# Patient Record
Sex: Female | Born: 1980 | Race: Black or African American | Hispanic: No | Marital: Married | Smoking: Never smoker
Health system: Southern US, Community
[De-identification: ages and names within clinical notes are randomized; demographics above are authoritative.]

## PROBLEM LIST (undated history)

## (undated) ENCOUNTER — Inpatient Hospital Stay (HOSPITAL_COMMUNITY): Payer: Self-pay

## (undated) DIAGNOSIS — R102 Pelvic and perineal pain: Secondary | ICD-10-CM

## (undated) DIAGNOSIS — N760 Acute vaginitis: Secondary | ICD-10-CM

## (undated) DIAGNOSIS — G8929 Other chronic pain: Secondary | ICD-10-CM

## (undated) DIAGNOSIS — Z862 Personal history of diseases of the blood and blood-forming organs and certain disorders involving the immune mechanism: Secondary | ICD-10-CM

## (undated) DIAGNOSIS — R519 Headache, unspecified: Secondary | ICD-10-CM

## (undated) DIAGNOSIS — B009 Herpesviral infection, unspecified: Secondary | ICD-10-CM

## (undated) DIAGNOSIS — Z8742 Personal history of other diseases of the female genital tract: Secondary | ICD-10-CM

## (undated) DIAGNOSIS — N83202 Unspecified ovarian cyst, left side: Secondary | ICD-10-CM

## (undated) DIAGNOSIS — E785 Hyperlipidemia, unspecified: Secondary | ICD-10-CM

## (undated) DIAGNOSIS — Z8619 Personal history of other infectious and parasitic diseases: Secondary | ICD-10-CM

## (undated) DIAGNOSIS — F419 Anxiety disorder, unspecified: Secondary | ICD-10-CM

## (undated) DIAGNOSIS — N809 Endometriosis, unspecified: Secondary | ICD-10-CM

## (undated) DIAGNOSIS — Z8719 Personal history of other diseases of the digestive system: Secondary | ICD-10-CM

## (undated) DIAGNOSIS — Z87898 Personal history of other specified conditions: Secondary | ICD-10-CM

## (undated) DIAGNOSIS — F410 Panic disorder [episodic paroxysmal anxiety] without agoraphobia: Secondary | ICD-10-CM

## (undated) DIAGNOSIS — N926 Irregular menstruation, unspecified: Secondary | ICD-10-CM

## (undated) DIAGNOSIS — B379 Candidiasis, unspecified: Secondary | ICD-10-CM

## (undated) DIAGNOSIS — O26899 Other specified pregnancy related conditions, unspecified trimester: Secondary | ICD-10-CM

## (undated) DIAGNOSIS — R12 Heartburn: Secondary | ICD-10-CM

## (undated) DIAGNOSIS — R42 Dizziness and giddiness: Secondary | ICD-10-CM

## (undated) DIAGNOSIS — Z973 Presence of spectacles and contact lenses: Secondary | ICD-10-CM

## (undated) DIAGNOSIS — D649 Anemia, unspecified: Secondary | ICD-10-CM

## (undated) DIAGNOSIS — B9689 Other specified bacterial agents as the cause of diseases classified elsewhere: Secondary | ICD-10-CM

## (undated) DIAGNOSIS — IMO0002 Reserved for concepts with insufficient information to code with codable children: Secondary | ICD-10-CM

## (undated) HISTORY — DX: Hyperlipidemia, unspecified: E78.5

## (undated) HISTORY — DX: Herpesviral infection, unspecified: B00.9

## (undated) HISTORY — DX: Acute vaginitis: N76.0

## (undated) HISTORY — DX: Candidiasis, unspecified: B37.9

## (undated) HISTORY — DX: Endometriosis, unspecified: N80.9

## (undated) HISTORY — DX: Headache, unspecified: R51.9

## (undated) HISTORY — DX: Other specified bacterial agents as the cause of diseases classified elsewhere: B96.89

## (undated) HISTORY — DX: Irregular menstruation, unspecified: N92.6

## (undated) HISTORY — DX: Other chronic pain: G89.29

## (undated) HISTORY — PX: DIAGNOSTIC LAPAROSCOPY: SUR761

## (undated) HISTORY — DX: Panic disorder (episodic paroxysmal anxiety): F41.0

## (undated) HISTORY — PX: WISDOM TOOTH EXTRACTION: SHX21

## (undated) HISTORY — DX: Personal history of other specified conditions: Z87.898

## (undated) HISTORY — PX: OTHER SURGICAL HISTORY: SHX169

## (undated) HISTORY — DX: Dizziness and giddiness: R42

## (undated) HISTORY — DX: Pelvic and perineal pain: R10.2

## (undated) HISTORY — DX: Personal history of other diseases of the digestive system: Z87.19

## (undated) HISTORY — DX: Reserved for concepts with insufficient information to code with codable children: IMO0002

## (undated) HISTORY — DX: Personal history of other diseases of the female genital tract: Z87.42

## (undated) HISTORY — DX: Personal history of other infectious and parasitic diseases: Z86.19

---
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
## 2005-11-13 ENCOUNTER — Other Ambulatory Visit: Admission: RE | Admit: 2005-11-13 | Discharge: 2005-11-13 | Payer: Self-pay | Admitting: Obstetrics and Gynecology

## 2007-04-28 DIAGNOSIS — Z8719 Personal history of other diseases of the digestive system: Secondary | ICD-10-CM

## 2007-04-28 HISTORY — DX: Personal history of other diseases of the digestive system: Z87.19

## 2009-04-09 HISTORY — PX: DIAGNOSTIC LAPAROSCOPY: SUR761

## 2009-10-23 ENCOUNTER — Emergency Department (HOSPITAL_COMMUNITY): Admission: EM | Admit: 2009-10-23 | Discharge: 2009-10-23 | Payer: Self-pay | Admitting: Emergency Medicine

## 2010-02-23 ENCOUNTER — Ambulatory Visit (HOSPITAL_COMMUNITY): Admission: RE | Admit: 2010-02-23 | Discharge: 2010-02-23 | Payer: Self-pay | Admitting: Obstetrics and Gynecology

## 2010-06-08 DIAGNOSIS — N809 Endometriosis, unspecified: Secondary | ICD-10-CM

## 2010-06-08 HISTORY — DX: Endometriosis, unspecified: N80.9

## 2010-06-20 LAB — SURGICAL PCR SCREEN
MRSA, PCR: NEGATIVE
Staphylococcus aureus: POSITIVE — AB

## 2010-06-20 LAB — CBC
Hemoglobin: 11.9 g/dL — ABNORMAL LOW (ref 12.0–15.0)
RBC: 4.22 MIL/uL (ref 3.87–5.11)

## 2010-06-24 LAB — URINALYSIS, ROUTINE W REFLEX MICROSCOPIC
Protein, ur: NEGATIVE mg/dL
Urobilinogen, UA: 0.2 mg/dL (ref 0.0–1.0)

## 2010-06-24 LAB — WET PREP, GENITAL

## 2010-06-24 LAB — DIFFERENTIAL
Basophils Absolute: 0 10*3/uL (ref 0.0–0.1)
Eosinophils Relative: 3 % (ref 0–5)
Lymphocytes Relative: 29 % (ref 12–46)
Monocytes Absolute: 0.4 10*3/uL (ref 0.1–1.0)
Monocytes Relative: 8 % (ref 3–12)

## 2010-06-24 LAB — CBC
HCT: 34.4 % — ABNORMAL LOW (ref 36.0–46.0)
MCHC: 33.2 g/dL (ref 30.0–36.0)
MCV: 85 fL (ref 78.0–100.0)
RDW: 14.8 % (ref 11.5–15.5)
WBC: 5.4 10*3/uL (ref 4.0–10.5)

## 2010-06-24 LAB — POCT PREGNANCY, URINE: Preg Test, Ur: NEGATIVE

## 2011-06-29 ENCOUNTER — Other Ambulatory Visit (INDEPENDENT_AMBULATORY_CARE_PROVIDER_SITE_OTHER): Payer: Self-pay

## 2011-06-29 DIAGNOSIS — N912 Amenorrhea, unspecified: Secondary | ICD-10-CM

## 2011-07-16 ENCOUNTER — Encounter (INDEPENDENT_AMBULATORY_CARE_PROVIDER_SITE_OTHER): Payer: Self-pay | Admitting: Registered Nurse

## 2011-07-16 ENCOUNTER — Encounter: Payer: Self-pay | Admitting: Obstetrics and Gynecology

## 2011-07-16 DIAGNOSIS — R5383 Other fatigue: Secondary | ICD-10-CM

## 2011-07-16 DIAGNOSIS — R5381 Other malaise: Secondary | ICD-10-CM

## 2011-07-17 ENCOUNTER — Telehealth: Payer: Self-pay | Admitting: Obstetrics and Gynecology

## 2011-07-20 ENCOUNTER — Telehealth: Payer: Self-pay

## 2011-07-20 NOTE — Telephone Encounter (Signed)
Spoke with pt rgd msg pt wants test results informed lad wnl pt voice understanding

## 2011-11-08 ENCOUNTER — Telehealth: Payer: Self-pay | Admitting: Obstetrics and Gynecology

## 2011-11-08 NOTE — Telephone Encounter (Signed)
Niccole/nd pt °

## 2011-11-08 NOTE — Telephone Encounter (Signed)
ND pt 

## 2011-11-08 NOTE — Telephone Encounter (Signed)
Spoke with pt rgd msg pt states having clear dischrage from both breast neg preg test wants eval offered pt an appt pt has appt 11/12/11 at 1:15 with ND pt voice understanding

## 2011-11-12 ENCOUNTER — Encounter: Payer: Self-pay | Admitting: Obstetrics and Gynecology

## 2011-11-12 ENCOUNTER — Ambulatory Visit (INDEPENDENT_AMBULATORY_CARE_PROVIDER_SITE_OTHER): Payer: Self-pay | Admitting: Obstetrics and Gynecology

## 2011-11-12 VITALS — BP 124/76 | Wt 276.0 lb

## 2011-11-12 DIAGNOSIS — N643 Galactorrhea not associated with childbirth: Secondary | ICD-10-CM | POA: Insufficient documentation

## 2011-11-12 DIAGNOSIS — N912 Amenorrhea, unspecified: Secondary | ICD-10-CM

## 2011-11-12 NOTE — Patient Instructions (Signed)

## 2011-11-12 NOTE — Progress Notes (Signed)
Pt c/o clear discharge from both breasts. Pt states she has been trying to conceive and that her cycle is late but home pregnancy tests were negative. UPT in office negative. Pt has gained ten pounds since last visit.  Her periods have been 2/8, 3/9, 4/12, 5/21, 6/20.  They have been normal.  She just noticed clear discharge from both breasts this month BP 124/76  Wt 276 lb (125.193 kg)  LMP 09/27/2011 Physical Examination: General appearance - alert, well appearing, and in no distress Chest - clear to auscultation, no wheezes, rales or rhonchi, symmetric air entry Heart - normal rate, regular rhythm, normal S1, S2, no murmurs, rubs, clicks or gallops, normal rate and regular rhythm Abdomen - soft, nontender, nondistended, no masses or organomegaly Breasts - breasts appear normal, no suspicious masses, no skin or nipple changes or axillary nodes Irregular menses Galactorrhea Check prolactin and beta quant.  tsh wnl 4/13 Pt declined full work up Encouraged exercise and weight loss with timed IC Take pnv qd Call if no menses in 3 months or positive UPT

## 2011-11-13 LAB — PROLACTIN: Prolactin: 7 ng/mL

## 2011-11-13 LAB — HCG, QUANTITATIVE, PREGNANCY: hCG, Beta Chain, Quant, S: <2 m[IU]/mL

## 2011-11-19 ENCOUNTER — Telehealth: Payer: Self-pay | Admitting: Obstetrics and Gynecology

## 2011-11-19 NOTE — Telephone Encounter (Signed)
Pt called regarding lab results for hcg and prolactin. Advised pt that hcg was <2 which means she is not pregnant and prolactin was 7, within normal range. Pt voiced understanding.

## 2012-01-22 ENCOUNTER — Encounter: Payer: Self-pay | Admitting: Obstetrics and Gynecology

## 2012-01-22 ENCOUNTER — Other Ambulatory Visit: Payer: Self-pay | Admitting: Obstetrics and Gynecology

## 2012-01-22 ENCOUNTER — Ambulatory Visit (INDEPENDENT_AMBULATORY_CARE_PROVIDER_SITE_OTHER): Payer: Self-pay | Admitting: Obstetrics and Gynecology

## 2012-01-22 ENCOUNTER — Ambulatory Visit (INDEPENDENT_AMBULATORY_CARE_PROVIDER_SITE_OTHER): Payer: Self-pay

## 2012-01-22 VITALS — BP 124/82 | Ht 67.0 in | Wt 280.0 lb

## 2012-01-22 DIAGNOSIS — N809 Endometriosis, unspecified: Secondary | ICD-10-CM | POA: Insufficient documentation

## 2012-01-22 DIAGNOSIS — N912 Amenorrhea, unspecified: Secondary | ICD-10-CM

## 2012-01-22 LAB — POCT URINE PREGNANCY: Preg Test, Ur: NEGATIVE

## 2012-01-22 MED ORDER — MEDROXYPROGESTERONE ACETATE 10 MG PO TABS: 10.0000 mg | ORAL_TABLET | Freq: Every day | ORAL | Status: DC

## 2012-01-22 NOTE — Progress Notes (Signed)
Pt states no cycle since 10/02/11 BP 124/82  Ht 5\' 7"  (1.702 m)  Wt 127.007 kg (280 lb)  BMI 43.85 kg/m2  LMP 09/27/2011  Results for orders placed in visit on 01/22/12  POCT URINE PREGNANCY      Component Value Range   Preg Test, Ur Negative    pt states she has not had a menses since June.  She has had a twenty pound weight gain since January  She also c/o feeling movement and wants and [place] to confirm she is not pregnant.  She declines a beta quant.  She also has had some pelvic pain in the last couple of weeks.  She has known endometriosis Physical Examination: General appearance - alert, well appearing, and in no distress Chest - clear to auscultation, no wheezes, rales or rhonchi, symmetric air entry Heart - normal rate and regular rhythm Abdomen - soft, nontender, nondistended, no masses or organomegaly Pelvic - normal external genitalia, vulva, vagina, cervix, uterus and adnexa Amenorrhea Pelvic pain Pt desires [place] to confirm she is not pregnant.  Will order.  Then pt can take provera 10 mg for five days if upt neg and no period for three months Pt does desire pregnancy but no intervention at this time

## 2012-01-22 NOTE — Patient Instructions (Signed)
Secondary Amenorrhea   Secondary amenorrhea is the stopping of menstrual flow for 3 to 6 months in a female who has previously had periods. There are many possible causes. Most of these causes are not serious. Usually treating the underlying problem causing the loss of menses will return your periods to normal.  CAUSES   Some common and uncommon causes of not menstruating include:  · Malnutrition.  · Low blood sugar (hypoglycemia).  · Polycystic ovarian disease.  · Stress or fear.  · Breastfeeding.  · Hormone imbalance.  · Ovarian failure.  · Medications.  · Extreme obesity.  · Cystic fibrosis.  · Low body weight or drastic weight reduction from any cause.  · Early menopause.  · Removal of ovaries or uterus.  · Contraceptives.  · Illness.  · Long term (chronic) illnesses.  · Cushing's syndrome.  · Thyroid problems.  · Birth control pills, patches, or vaginal rings for birth control.  DIAGNOSIS   This diagnosis is made by your caregiver taking a medical history and doing a physical exam. Pregnancy must be ruled out. Often times, numerous blood tests of different hormones in the body may be measured. Urine testing may be done. Specialized x-rays may have to be done as well as measuring the body mass index (BMI).  TREATMENT   Treatment depends on the cause of the amenorrhea. If an eating disorder is present, this can be treated with an adequate diet and therapy. Chronic illnesses may improve with treatment of the illness. Overall, the outlook is good. The amenorrhea may be corrected with medications, lifestyle changes, or surgery. If the amenorrhea cannot be corrected, it is sometimes possible to create a false menstruation with medications.  Document Released: 05/07/2006 Document Revised: 06/18/2011 Document Reviewed: 03/14/2007  [employer]® Patient Information ©2013 [employer].

## 2012-05-24 ENCOUNTER — Other Ambulatory Visit: Payer: Self-pay

## 2013-02-03 ENCOUNTER — Other Ambulatory Visit (HOSPITAL_COMMUNITY): Payer: Self-pay | Admitting: Gynecology

## 2013-02-03 DIAGNOSIS — Z3141 Encounter for fertility testing: Secondary | ICD-10-CM

## 2013-02-10 ENCOUNTER — Ambulatory Visit (HOSPITAL_COMMUNITY)
Admission: RE | Admit: 2013-02-10 | Discharge: 2013-02-10 | Disposition: A | Discharge status: Home / Self Care | Payer: BC Managed Care – PPO | Source: Ambulatory Visit | Attending: Gynecology | Admitting: Gynecology

## 2013-02-10 DIAGNOSIS — N979 Female infertility, unspecified: Secondary | ICD-10-CM | POA: Insufficient documentation

## 2013-02-10 DIAGNOSIS — Z3141 Encounter for fertility testing: Secondary | ICD-10-CM

## 2013-02-10 MED ORDER — IOHEXOL 300 MG/ML  SOLN
10.0000 mL | Freq: Once | INTRAMUSCULAR | Status: AC | PRN
  Administered 2013-02-10: 17 mL

## 2013-02-12 ENCOUNTER — Other Ambulatory Visit: Payer: Self-pay

## 2013-04-29 ENCOUNTER — Ambulatory Visit (INDEPENDENT_AMBULATORY_CARE_PROVIDER_SITE_OTHER): Payer: BC Managed Care – PPO | Admitting: General Surgery

## 2013-04-29 ENCOUNTER — Encounter (INDEPENDENT_AMBULATORY_CARE_PROVIDER_SITE_OTHER): Payer: Self-pay | Admitting: General Surgery

## 2013-04-29 VITALS — BP 131/84 | HR 71 | Temp 98.1°F | Resp 14 | Ht 67.0 in | Wt 263.0 lb

## 2013-04-29 DIAGNOSIS — K602 Anal fissure, unspecified: Secondary | ICD-10-CM

## 2013-04-29 NOTE — Patient Instructions (Signed)
WHAT IS AN ANAL FISSURE? An anal fissure (fissure-in-ano) is a small, oval shaped tear in skin that lines the opening of the anus. Fissures typically cause severe pain and bleeding with bowel movements. Fissures are quite common in the general population, but are often confused with other causes of pain and bleeding, such as hemorrhoids. WHAT ARE THE SYMPTOMS OF AN ANAL FISSURE? The typical symptoms of an anal fissure include severe pain during, and especially after, a bowel movement, lasting from several minutes to a few hours. Patients may also notice bright red blood from the anus that can be seen on the toilet paper or on the stool. Between bowel movements, patients with anal fissures are often relatively symptom-free. Many patients are fearful of having a bowel movement and may try to avoid defecation secondary to the pain.  WHAT CAUSES AN ANAL FISSURE? Fissures are usually caused by trauma to the inner lining of the anus. Patients with tight anal sphincter muscles (i.e., increased muscle tone) are more prone to developing anal fissures. A hard, dry bowel movement is typically responsible, but loose stools and diarrhea can also be the cause. Following a bowel movement, severe anal pain can produce spasm of the anal sphincter muscle, resulting in a decrease in blood flow to the site of the injury, thus impairing healing of the wound. The next bowel movement results in more pain, anal spasm, decreased blood flow to the area, and the cycle continues. Treatments are aimed at interrupting this cycle by relaxing the anal sphincter muscle to promote healing of the fissure.  Other, less common, causes include inflammatory conditions and certain anal infections or tumors. Anal fissures may be acute (recent onset) or chronic (present for a long period of time). Chronic fissures may be more difficult to treat, and may also have an external lump associated with the tear, called a sentinel pile or skin tag, as well as  extra tissue just inside the anal canal (hypertrophied papilla) . WHAT IS THE TREATMENT OF ANAL FISSURES? The majority of anal fissures do not require surgery. The most common treatment for an acute anal fissure consists of making the stool more formed and bulky with a diet high in fiber and utilization of over-the-counter fiber supplementation (totaling 25-35 grams of fiber/day). Stool softeners and increasing water intake may be necessary to promote soft bowel movements and aid in the healing process. Topical anesthetics for pain and warm tub baths (sitz baths) for 10-20 minutes several times a day (especially after bowel movements) are soothing and promote relaxation of the anal muscles, which may help the healing process.  Other medications (such as nitroglycerin, nifedipine, or diltiazem) may be prescribed that allow relaxation of the anal sphincter muscles. Your surgeon will go over benefits and side-effects of each of these with you. Narcotic pain medications are not recommended for anal fissures, as they promote constipation. Chronic fissures are generally more difficult to treat, and your surgeon may advise surgical treatment. WILL THE PROBLEM RETURN? Fissures can recur easily, and it is quite common for a fully healed fissure to recur after a hard bowel movement or other trauma. Even when the pain and bleeding have subsided, it is very important to continue good bowel habits and a diet high in fiber as a lifestyle change. If the problem returns without an obvious cause, further assessment is warranted. WHAT CAN BE DONE IF THE FISSURE DOES NOT HEAL? A fissure that fails to respond to conservative measures should be re-examined. Persistent hard or loose  bowel movements, scarring, or spasm of the internal anal muscle all contribute to delayed healing. Other medical problems such as inflammatory bowel disease (Crohn's disease), infections, or anal tumors can cause symptoms similar to anal fissures.  Patients suffering from persistent anal pain should be examined to exclude these symptoms. This may include a colonoscopy or an exam in the operating room under anesthesia. WHAT DOES SURGERY INVOLVE? Surgical options for treating anal fissure include Botulinum toxin (Botox) injection into the anal sphincter and surgical division of a portion of the internal anal sphincter (lateral internal sphincterotomy). Both of these are performed typically as outpatient, same-day procedures, or occasionally in the office setting. The goal of these surgical options is to promote relaxation of the anal sphincter, thereby decreasing anal pain and spasm, allowing the fissure to heal. Botox injection results in healing in 50-80% of patients, while sphincterotomy is reported to be over 90% successful. If a sentinel pile is present, it may be removed to promote healing of the fissure. All surgical procedures carry some risk, and a sphincterotomy can rarely interfere with one's ability to control gas and stool. Your colon and rectal surgeon will discuss these risks with you to determine the appropriate treatment for your particular situation. HOW LONG IS THE RECOVERY AFTER SURGERY? It is important to note that complete healing with both medical and surgical treatments can take up to approximately 6-10 weeks. However, acute pain after surgery often disappears after a few days. Most patients will be able to return to work and resume daily activities in a few short days after the surgery. CAN FISSURES LEAD TO COLON CANCER? Absolutely not. Persistent symptoms, however, need careful evaluation since other conditions other than an anal fissure can cause similar symptoms. Your colon and rectal surgeon may request additional tests, even if your fissure has successfully healed. A colonoscopy may be required to exclude other causes of rectal bleeding. WHAT IS A COLON AND RECTAL SURGEON? Colon and rectal surgeons are experts in the  surgical and non-surgical treatment of diseases of the colon, rectum and anus. They have completed advanced surgical training in the treatment of these diseases as well as full general surgical training. Board-certified colon and rectal surgeons complete residencies in general surgery and colon and rectal surgery, and pass intensive examinations conducted by the American Board of Surgery and the American Board of Colon and Rectal Surgery. They are well-versed in the treatment of both benign and malignant diseases of the colon, rectum and anus and are able to perform routine screening examinations and surgically treat conditions if indicated to do so.  Author: Venia Carbon. Claris Che, DO, on behalf of the [place]   2012 American Society of Colon & Rectal Surgeons

## 2013-04-29 NOTE — Progress Notes (Signed)
Chief Complaint  Patient presents with  . Rectal Problems    HISTORY: Mikayla Humphrey is a 33 y.o. female who presents to the office with anal pain.  Other symptoms include itching and burning, blood on toilet paper.  This had been occurring for many years.  She has tried fiber, stool softeners and water intake in the past with the water helping the most.  BM's makes the symptoms worse.   It is intermittent in nature.  Her bowel habits are regular and her bowel movements are usually soft.  Her fiber intake is dietary.  SHe has never had a colonoscopy.    Past Medical History  Diagnosis Date  . Chronic pelvic pain in female 02/16/10  . 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 H/O hemorrhoids   . Abnormal Pap smear 2002  . Hx of bacterial infection   . Yeast infection   . BV (bacterial vaginosis) 05/07/06  . 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 Irregular periods/menstrual cycles 2008  . H/O fatigue 2008  . Candidal vaginitis 2009  . H/O dysmenorrhea 11/10/09  . Endometriosis 06/08/2010  . H/O amenorrhea 07/16/11  . H/O heartburn 07/16/2011  . Hyperlipidemia       Past Surgical History  Procedure Laterality Date  . Cesarean section    . Scare tissue          Current Outpatient Prescriptions  Medication Sig Dispense Refill  . Prenatal MV-Min-Fe Fum-FA-DHA (PRENATAL 1 PO) Take by mouth.      . medroxyPROGESTERone (PROVERA) 10 MG tablet Take 1 tablet (10 mg total) by mouth daily.  30 tablet  1   No current facility-administered medications for this visit.      Allergies  Allergen Reactions  . Amoxicillin       History reviewed. No pertinent family history.  History   Social History  . Marital Status: Married    Spouse Name: N/A    Number of Children: N/A  . Years of Education: N/A   Social History Main Topics  . Smoking status: Never Smoker   . Smokeless tobacco: Never Used  . Alcohol Use: No  . Drug Use: No  . Sexual Activity: Yes    Birth Control/  Protection: None   Other Topics Concern  . None   Social History Narrative  . None      REVIEW OF SYSTEMS - PERTINENT POSITIVES ONLY: Review of Systems - General ROS: negative for - chills, fever or weight loss Hematological and Lymphatic ROS: negative for - bleeding problems, blood clots or bruising Respiratory ROS: no cough, shortness of breath, or wheezing Cardiovascular ROS: no chest pain or dyspnea on exertion Gastrointestinal ROS: no abdominal pain, change in bowel habits, or black or bloody stools Genito-Urinary ROS: no dysuria, trouble voiding, or hematuria  EXAM: Filed Vitals:   04/29/13 1552  BP: 131/84  Pulse: 71  Temp: 98.1 F (36.7 C)  Resp: 14    General appearance: alert and cooperative Resp: clear to auscultation bilaterally Cardio: regular rate and rhythm GI: normal findings: soft, non-tender   Procedure: Anoscopy Surgeon: Marcello Moores Diagnosis: anal pain  Assistant: Alphonzo Severance After the risks and benefits were explained, verbal consent was obtained for above procedure  Anesthesia: none Findings: posterior anal fissure    ASSESSMENT AND PLAN: Mikayla Humphrey Is a 33 year old female with complaints of anal pain. On exam she has a posterior anal fissure. 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 I have given  her some diltiazem cream and she will use sitz baths and stool softeners to help heal this fissure. I will see her back in 8 weeks.    Rosario Adie, MD Colon and Rectal Surgery / [facility]      Visit Diagnoses: 1. Anal fissure     Primary Care Physician: Jonathon Bellows, MD

## 2013-06-11 ENCOUNTER — Ambulatory Visit (INDEPENDENT_AMBULATORY_CARE_PROVIDER_SITE_OTHER): Payer: BC Managed Care – PPO | Admitting: General Surgery

## 2013-06-18 ENCOUNTER — Encounter (INDEPENDENT_AMBULATORY_CARE_PROVIDER_SITE_OTHER): Payer: Self-pay | Admitting: General Surgery

## 2013-07-08 ENCOUNTER — Encounter (INDEPENDENT_AMBULATORY_CARE_PROVIDER_SITE_OTHER): Payer: Self-pay | Admitting: General Surgery

## 2013-07-08 ENCOUNTER — Ambulatory Visit (INDEPENDENT_AMBULATORY_CARE_PROVIDER_SITE_OTHER): Payer: BC Managed Care – PPO | Admitting: General Surgery

## 2013-07-08 ENCOUNTER — Telehealth (INDEPENDENT_AMBULATORY_CARE_PROVIDER_SITE_OTHER): Payer: Self-pay | Admitting: General Surgery

## 2013-07-08 VITALS — BP 104/70 | HR 82 | Temp 97.6°F | Resp 16 | Ht 66.0 in | Wt 262.8 lb

## 2013-07-08 DIAGNOSIS — K6289 Other specified diseases of anus and rectum: Secondary | ICD-10-CM

## 2013-07-08 NOTE — Progress Notes (Signed)
Mikayla Humphrey is a 33 y.o. female who is here for a follow up visit regarding her anal fissure.  She was last seen on 04/29/13.  A prescription for diltiazem ointment was given to the patient. She has uses 3-4 times a day for the past 6 weeks. She has had some improvement in her symptoms but still is having pain and irritation in her posterior anal canal. She has occasional hard stools. She is not on a fiber supplement.   Objective: Filed Vitals:   07/08/13 1126  BP: 104/70  Pulse: 82  Temp: 97.6 F (36.4 C)  Resp: 16    General appearance: alert and cooperative GI: normal findings: soft, non-tender Anal findings: Area of granulation tissue in the posterior midline of the external anal canal, small anal fissure noted proximally to this. No fluctuant masses noted  Assessment and Plan: Mikayla Humphrey Is a 33 year old female who has been treated for an anal fissure. She has not had complete resolution of her symptoms with diltiazem ointment. I have recommended an exam under anesthesia with possible fistulotomy. If she is determined to have a residual fissure and sphincter hypertension, I have also recommended chemical sphincterotomy. I discussed the risk of this which included continued anal pain and bleeding, recurrence of fissure and fecal leakage.    Rosario Adie, MD [facility], [place]

## 2013-07-08 NOTE — Telephone Encounter (Signed)
Pt to call back to schedule surgery has financial information  Checking with OB/GYN maybe pregnant doing IVF

## 2013-07-08 NOTE — Patient Instructions (Signed)

## 2013-07-16 ENCOUNTER — Ambulatory Visit (INDEPENDENT_AMBULATORY_CARE_PROVIDER_SITE_OTHER): Payer: BC Managed Care – PPO | Admitting: General Surgery

## 2013-08-04 ENCOUNTER — Telehealth (INDEPENDENT_AMBULATORY_CARE_PROVIDER_SITE_OTHER): Payer: Self-pay

## 2013-08-04 ENCOUNTER — Other Ambulatory Visit (INDEPENDENT_AMBULATORY_CARE_PROVIDER_SITE_OTHER): Payer: Self-pay | Admitting: General Surgery

## 2013-08-04 DIAGNOSIS — K602 Anal fissure, unspecified: Secondary | ICD-10-CM

## 2013-08-04 MED ORDER — LIDOCAINE 5 % EX OINT: 1.0000 "application " | TOPICAL_OINTMENT | Freq: Two times a day (BID) | CUTANEOUS | Status: DC | PRN

## 2013-08-04 NOTE — Telephone Encounter (Signed)
Advised patient she can have her surgery now or wait until her 3 trimester of pregnancy per Dr. Marcello Moores, patient states she is wanting to wait on surgery until 3 trimester  if Dr. Marcello Moores would prescribe lidocaine which she had mentioned to her.  II will be calling to inform her the RX is ready at her pharmacy.

## 2013-08-13 ENCOUNTER — Other Ambulatory Visit: Payer: Self-pay

## 2013-08-17 LAB — OB RESULTS CONSOLE RUBELLA ANTIBODY, IGM: Rubella: IMMUNE

## 2013-08-17 LAB — OB RESULTS CONSOLE HIV ANTIBODY (ROUTINE TESTING): HIV: NONREACTIVE

## 2013-08-17 LAB — OB RESULTS CONSOLE RPR: RPR: NONREACTIVE

## 2013-09-01 ENCOUNTER — Ambulatory Visit (HOSPITAL_COMMUNITY)
Admission: RE | Admit: 2013-09-01 | Discharge: 2013-09-01 | Disposition: A | Discharge status: Home / Self Care | Payer: BC Managed Care – PPO | Source: Ambulatory Visit | Attending: Obstetrics and Gynecology | Admitting: Obstetrics and Gynecology

## 2013-09-01 ENCOUNTER — Encounter (HOSPITAL_COMMUNITY): Payer: Self-pay

## 2013-09-01 VITALS — BP 125/68 | HR 80 | Wt 268.2 lb

## 2013-09-01 DIAGNOSIS — K602 Anal fissure, unspecified: Secondary | ICD-10-CM | POA: Insufficient documentation

## 2013-09-01 DIAGNOSIS — Z1389 Encounter for screening for other disorder: Secondary | ICD-10-CM

## 2013-09-01 DIAGNOSIS — O09219 Supervision of pregnancy with history of pre-term labor, unspecified trimester: Secondary | ICD-10-CM | POA: Insufficient documentation

## 2013-09-01 DIAGNOSIS — O9989 Other specified diseases and conditions complicating pregnancy, childbirth and the puerperium: Principal | ICD-10-CM

## 2013-09-01 DIAGNOSIS — O99891 Other specified diseases and conditions complicating pregnancy: Secondary | ICD-10-CM | POA: Insufficient documentation

## 2013-09-01 DIAGNOSIS — O09299 Supervision of pregnancy with other poor reproductive or obstetric history, unspecified trimester: Secondary | ICD-10-CM | POA: Insufficient documentation

## 2013-09-01 NOTE — Consult Note (Signed)
Maternal Fetal Medicine Consultation  Requesting Provider(s): Louretta Shorten, MD  Reason for consultation: Possible cervical insufficiency  HPI: Mikayla Humphrey is a 33 yo G4P1021, EDD 03/22/2014 who is currently at [redacted]w[redacted]d seen for consultation due to possible cervical insufficiency.  She reports at previous 19 4/7 week delivery in 1999 - presented to her OB visit for a PAP smear and was noted to have hour-glassing membranes.  She experienced ruptured membranes and delivered shortly thereafter.  She denies any vaginal bleeding or contractions at that time - was completely asymptomatic at the time of presentation.  The patient subsequently delivered a 9#5oz infant via C-section for arrest of dilation in 2000.  She reports that she did not undergo cerclage during that pregnancy.  She is currently without complaints.  She denies vaginal bleeding.  OB History: OB History   Grav Para Term Preterm Abortions TAB SAB Ect Mult Living   4 1 1  2  1   1       PMH:  Past Medical History  Diagnosis Date  . Chronic pelvic pain in female 02/16/10  . 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 H/O hemorrhoids   . Abnormal Pap smear 2002  . Hx of bacterial infection   . Yeast infection   . BV (bacterial vaginosis) 05/07/06  . 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 Irregular periods/menstrual cycles 2008  . H/O fatigue 2008  . Candidal vaginitis 2009  . H/O dysmenorrhea 11/10/09  . Endometriosis 06/08/2010  . H/O amenorrhea 07/16/11  . H/O heartburn 07/16/2011  . Hyperlipidemia     PSH:  Past Surgical History  Procedure Laterality Date  . Cesarean section    . Scare tissue     Meds: Prenatal vitamins  Allergies:  Allergies  Allergen Reactions  . Amoxicillin    FH: Sister with mental retardation (unknown etiology).  Otherwise denies family history of birth defects or hereditary disorders.  Soc:  History   Social History  . Marital Status: Married    Spouse Name: N/A    Number of  Children: N/A  . Years of Education: N/A   Occupational History  . Not on file.   Social History Main Topics  . Smoking status: Never Smoker   . Smokeless tobacco: Never Used  . Alcohol Use: No  . Drug Use: No  . Sexual Activity: Yes    Birth Control/ Protection: None   Other Topics Concern  . Not on file   Social History Narrative  . No narrative on file    Review of Systems: no vaginal bleeding or cramping/contractions, no LOF, no nausea/vomiting. All other systems reviewed and are negative.  PE:  268#, 125/68,80   A/P: 1) Single IUP at [redacted]w[redacted]d         2) Hx of midtrimester loss - history suspicious for cervical insufficiency, although with a subsequent normal term vaginal delivery without cerclage makes this diagnosis less likely.         3) Anal fissure now with anal fistula - patient reports that she would like to defer surgical intervention until after delivery  Recommendations: 1) Serial cervical lengths - would begin cervical surveillance beginning at [redacted] weeks gestation with follow up every 1-2 weeks until [redacted] weeks gestation.  Would offer cerclage for shortened cervix (< 2.5 cm) given the patient's history.  The patient requested that the ultrasounds be performed in our office.  Her first study was tentatively scheduled for 16 weeks.  Thank you  for the opportunity to be a part of the care of Mikayla Humphrey. Please contact our office if we can be of further assistance.   I spent approximately 30 minutes with this patient with over 50% of time spent in face-to-face counseling.  Benjaman Lobe, MD Maternal Fetal Medicine

## 2013-09-01 NOTE — Addendum Note (Signed)
Encounter addended by: Clarene Duke, RN on: 09/01/2013  2:47 PM<BR>     Documentation filed: LN Prenatal Vitals

## 2013-09-02 ENCOUNTER — Encounter (HOSPITAL_COMMUNITY): Payer: Self-pay | Admitting: Emergency Medicine

## 2013-09-02 ENCOUNTER — Emergency Department (HOSPITAL_COMMUNITY)
Admission: EM | Admit: 2013-09-02 | Discharge: 2013-09-02 | Disposition: A | Discharge status: Home / Self Care | Payer: BC Managed Care – PPO | Attending: Emergency Medicine | Admitting: Emergency Medicine

## 2013-09-02 DIAGNOSIS — Z8679 Personal history of other diseases of the circulatory system: Secondary | ICD-10-CM | POA: Insufficient documentation

## 2013-09-02 DIAGNOSIS — Z8742 Personal history of other diseases of the female genital tract: Secondary | ICD-10-CM | POA: Insufficient documentation

## 2013-09-02 DIAGNOSIS — O200 Threatened abortion: Secondary | ICD-10-CM | POA: Insufficient documentation

## 2013-09-02 DIAGNOSIS — Z8619 Personal history of other infectious and parasitic diseases: Secondary | ICD-10-CM | POA: Insufficient documentation

## 2013-09-02 DIAGNOSIS — Z862 Personal history of diseases of the blood and blood-forming organs and certain disorders involving the immune mechanism: Secondary | ICD-10-CM | POA: Insufficient documentation

## 2013-09-02 DIAGNOSIS — Z79899 Other long term (current) drug therapy: Secondary | ICD-10-CM | POA: Insufficient documentation

## 2013-09-02 DIAGNOSIS — Z8639 Personal history of other endocrine, nutritional and metabolic disease: Secondary | ICD-10-CM | POA: Insufficient documentation

## 2013-09-02 DIAGNOSIS — Z880 Allergy status to penicillin: Secondary | ICD-10-CM | POA: Insufficient documentation

## 2013-09-02 DIAGNOSIS — G8929 Other chronic pain: Secondary | ICD-10-CM | POA: Insufficient documentation

## 2013-09-02 LAB — CBC WITH DIFFERENTIAL/PLATELET
Basophils Absolute: 0 10*3/uL (ref 0.0–0.1)
Basophils Relative: 0 % (ref 0–1)
EOS PCT: 1 % (ref 0–5)
Eosinophils Absolute: 0.1 10*3/uL (ref 0.0–0.7)
HEMATOCRIT: 34.8 % — AB (ref 36.0–46.0)
Hemoglobin: 11.3 g/dL — ABNORMAL LOW (ref 12.0–15.0)
LYMPHS ABS: 2.4 10*3/uL (ref 0.7–4.0)
LYMPHS PCT: 26 % (ref 12–46)
MCH: 26.8 pg (ref 26.0–34.0)
MCHC: 32.5 g/dL (ref 30.0–36.0)
MCV: 82.7 fL (ref 78.0–100.0)
MONO ABS: 0.6 10*3/uL (ref 0.1–1.0)
Monocytes Relative: 6 % (ref 3–12)
Neutro Abs: 6.3 10*3/uL (ref 1.7–7.7)
Neutrophils Relative %: 67 % (ref 43–77)
Platelets: 217 10*3/uL (ref 150–400)
RBC: 4.21 MIL/uL (ref 3.87–5.11)
RDW: 14.6 % (ref 11.5–15.5)
WBC: 9.4 10*3/uL (ref 4.0–10.5)

## 2013-09-02 LAB — URINALYSIS, ROUTINE W REFLEX MICROSCOPIC
Bilirubin Urine: NEGATIVE
Glucose, UA: NEGATIVE mg/dL
KETONES UR: 40 mg/dL — AB
Leukocytes, UA: NEGATIVE
NITRITE: NEGATIVE
PROTEIN: NEGATIVE mg/dL
Specific Gravity, Urine: 1.015 (ref 1.005–1.030)
Urobilinogen, UA: 0.2 mg/dL (ref 0.0–1.0)
pH: 5.5 (ref 5.0–8.0)

## 2013-09-02 LAB — URINE MICROSCOPIC-ADD ON

## 2013-09-02 LAB — BASIC METABOLIC PANEL
BUN: 7 mg/dL (ref 6–23)
CHLORIDE: 98 meq/L (ref 96–112)
CO2: 20 mEq/L (ref 19–32)
Calcium: 9.3 mg/dL (ref 8.4–10.5)
Creatinine, Ser: 0.53 mg/dL (ref 0.50–1.10)
Glucose, Bld: 72 mg/dL (ref 70–99)
POTASSIUM: 4.5 meq/L (ref 3.7–5.3)
SODIUM: 133 meq/L — AB (ref 137–147)

## 2013-09-02 LAB — ABO/RH: ABO/RH(D): A POS

## 2013-09-02 LAB — HCG, QUANTITATIVE, PREGNANCY: HCG, BETA CHAIN, QUANT, S: 73753 m[IU]/mL — AB (ref <5)

## 2013-09-02 NOTE — ED Provider Notes (Signed)
CSN: [id number]     Arrival date & time 09/02/13  1800 History   First MD Initiated Contact with Patient 09/02/13 1836     Chief Complaint  Patient presents with  . Vaginal Bleeding     (Consider location/radiation/quality/duration/timing/severity/associated sxs/prior Treatment) The history is provided by the patient. The history is limited by the condition of the patient.    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  OB is Dr Gaetano Net.  Pt has previously had spontaneous abortion at 8 weeks.  Has carried one pregnancy to term without cerclage.    Pt reports she noticed some brown discharge yesterday that resolved, had some mild lower abdominal cramping.  She spoke with her OB office and was told this was normal.  Today she was driving and had a gush of bright red blood with dark spots.  States she still feels some blood coming out.  Has suprapubic cramping that is 3/10 intensity.    Denies fevers, urinary symptoms, changes in bowel habits, leg swelling.     Past Medical History  Diagnosis Date  . Chronic pelvic pain in female 02/16/10  . 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 H/O hemorrhoids   . Abnormal Pap smear 2002  . Hx of bacterial infection   . Yeast infection   . BV (bacterial vaginosis) 05/07/06  . 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 Irregular periods/menstrual cycles 2008  . H/O fatigue 2008  . Candidal vaginitis 2009  . H/O dysmenorrhea 11/10/09  . Endometriosis 06/08/2010  . H/O amenorrhea 07/16/11  . H/O heartburn 07/16/2011  . Hyperlipidemia    Past Surgical History  Procedure Laterality Date  . Cesarean section    . Scare tissue     No family history on file. History  Substance Use Topics  . Smoking status: Never Smoker   . Smokeless tobacco: Never Used  . Alcohol Use: No   OB History   Grav Para Term Preterm Abortions TAB SAB Ect Mult Living    4 1 1  2  1   1      Review of Systems  All other systems reviewed and are negative.     Allergies  Amoxicillin  Home Medications   Prior to Admission medications   Medication Sig Start Date End Date Taking? Authorizing Provider  FOLLISTIM AQ 300 UNT/0.36ML SOLN  06/27/13   Historical Provider, MD  lidocaine (XYLOCAINE) 5 % ointment Apply 1 application topically 2 (two) times daily as needed. 07/06/90   Leighton Ruff, MD  medroxyPROGESTERone (PROVERA) 10 MG tablet Take 1 tablet (10 mg total) by mouth daily. 01/22/12 01/26/12  Naima A Dillard, MD  OVIDREL 250 MCG/0.5ML injection  06/22/13   Historical Provider, MD  Prenatal MV-Min-Fe Fum-FA-DHA (PRENATAL 1 PO) Take by mouth.    Historical Provider, MD   BP 125/61  Pulse 86  Temp(Src) 98.1 F (36.7 C) (Oral)  Resp 20  Ht 5\' 7"  (1.702 m)  Wt 266 lb (120.657 kg)  BMI 41.65 kg/m2  SpO2 100% Physical Exam  Nursing note and vitals reviewed. Constitutional: She appears well-developed and well-nourished. No distress.  HENT:  Head: Normocephalic and atraumatic.  Neck: Neck supple.  Cardiovascular: Normal rate and regular rhythm.   Pulmonary/Chest: Effort normal and breath sounds normal. No respiratory distress. She has no wheezes. She has no rales.  Abdominal: Soft. She exhibits no distension. There  is tenderness (very mild suprapubic tenderness). There is no rebound and no guarding.  Genitourinary: Cervix exhibits discharge. There is bleeding around the vagina.  Os is closed.  Small amount of dark blood in vagina.  No clots, no tissue noted.    Neurological: She is alert.  Skin: She is not diaphoretic.    ED Course  Procedures (including critical care time) Labs Review Labs Reviewed  CBC WITH DIFFERENTIAL - Abnormal; Notable for the following:    Hemoglobin 11.3 (*)    HCT 34.8 (*)    All other components within normal limits  BASIC METABOLIC PANEL - Abnormal; Notable for the following:    Sodium 133 (*)    All other  components within normal limits  HCG, QUANTITATIVE, PREGNANCY - Abnormal; Notable for the following:    hCG, Beta Chain, Quant, S 73753 (*)    All other components within normal limits  URINALYSIS, ROUTINE W REFLEX MICROSCOPIC - Abnormal; Notable for the following:    APPearance CLOUDY (*)    Hgb urine dipstick LARGE (*)    Ketones, ur 40 (*)    All other components within normal limits  URINE MICROSCOPIC-ADD ON - Abnormal; Notable for the following:    Bacteria, UA FEW (*)    All other components within normal limits  ABO/RH    Imaging Review No results found.   EKG Interpretation None      Discussed pt with Dr Jeneen Rinks.    MDM   Final diagnoses:  Threatened abortion    G4P1021 at [redacted]w[redacted]d gestation with confirmed IUP p/w vaginal bleeding.  Small amount of dark blood on exam, os is closed. Blood type is A positive.  Anemic but Hgb stable.  Bedside US by Dr Jeneen Rinks showing IUP with cardiac activity. Pt d/c home with vaginal rest precautions, close OB follow up, referred to [facility] for worsening symptoms.  Discussed result, findings, treatment, and follow up  with patient.  Pt given return precautions.  Pt verbalizes understanding and agrees with plan.       Clayton Bibles, PA-C 09/02/13 2311

## 2013-09-02 NOTE — Discharge Instructions (Signed)
Read the information below.  You may return to the Emergency Department at any time for worsening condition or any new symptoms that concern you.  Drink plenty of fluids and get lots of rest.  Do not insert anything at all into your vagina.  Call Dr Gaetano Net tomorrow to schedule a close follow up appointment.   If you have increased abdominal or pelvic pain or increased bleeding, go directly to [facility] for a recheck.    Threatened Miscarriage Bleeding during the first 20 weeks of pregnancy is common. This is sometimes called a threatened miscarriage. This is a pregnancy that is threatening to end before the twentieth week of pregnancy. Often this bleeding stops with bed rest or decreased activities as suggested by your caregiver and the pregnancy continues without any more problems. You may be asked to not have sexual intercourse, have orgasms or use tampons until further notice. Sometimes a threatened miscarriage can progress to a complete or incomplete miscarriage. This may or may not require further treatment. Some miscarriages occur before a woman misses a menstrual period and knows she is pregnant. Miscarriages occur in 62 to 20% of all pregnancies and usually occur during the first 13 weeks of the pregnancy. The exact cause of a miscarriage is usually never known. A miscarriage is natures way of ending a pregnancy that is abnormal or would not make it to term. There are some things that may put you at risk to have a miscarriage, such as:  Hormone problems.  Infection of the uterus or cervix.  Chronic illness, diabetes for example, especially if it is not controlled.  Abnormal shaped uterus.  Fibroids in the uterus.  Incompetent cervix (the cervix is too weak to hold the baby).  Smoking.  Drinking too much alcohol. It's best not to drink any alcohol when you are pregnant.  Taking illegal drugs. TREATMENT  When a miscarriage becomes complete and all products of conception (all  the tissue in the uterus) have been passed, often no treatment is needed. If you think you passed tissue, save it in a container and take it to your doctor for evaluation. If the miscarriage is incomplete (parts of the fetus or placenta remain in the uterus), further treatment may be needed. The most common reason for further treatment is continued bleeding (hemorrhage) because pregnancy tissue did not pass out of the uterus. This often occurs if a miscarriage is incomplete. Tissue left behind may also become infected. Treatment usually is dilatation and curettage (the removal of the remaining products of pregnancy. This can be done by a simple sucking procedure (suction curettage) or a simple scraping of the inside of the uterus. This may be done in the hospital or in the caregiver's office. This is only done when your caregiver knows that there is no chance for the pregnancy to proceed to term. This is determined by physical examination, negative pregnancy test, falling pregnancy hormone count and/or, an ultrasound revealing a dead fetus. Miscarriages are often a very emotional time for prospective mothers and fathers. This is not you or your partners fault. It did not occur because of an inadequacy in you or your partner. Nearly all miscarriages occur because the pregnancy has started off wrongly. At least half of these pregnancies have a chromosomal abnormality. It is almost always not inherited. Others may have developmental problems with the fetus or placenta. This does not always show up even when the products miscarried are studied under the microscope. The miscarriage is nearly always  not your fault and it is not likely that you could have prevented it from happening. If you are having emotional and grieving problems, talk to your health care provider and even seek counseling, if necessary, before getting pregnant again. You can begin trying for another pregnancy as soon as your caregiver says it is  OK. HOME CARE INSTRUCTIONS   Your caregiver may order bed rest depending on how much bleeding and cramping you are having. You may be limited to only getting up to go to the bathroom. You may be allowed to continue light activity. You may need to make arrangements for the care of your other children and for any other responsibilities.  Keep track of the number of pads you use each day, how often you have to change pads and how saturated (soaked) they are. Record this information.  DO NOT USE TAMPONS. Do not douche, have sexual intercourse or orgasms until approved by your caregiver.  You may receive a follow up appointment for re-evaluation of your pregnancy and a repeat blood test. Re-evaluation often occurs after 2 days and again in 4 to 6 weeks. It is very important that you follow-up in the recommended time period.  If you are Rh negative and the father is Rh positive or you do not know the fathers' blood type, you may receive a shot (Rh immune globulin) to help prevent abnormal antibodies that can develop and affect the baby in any future pregnancies. SEEK IMMEDIATE MEDICAL CARE IF:  You have severe cramps in your stomach, back, or abdomen.  You have a sudden onset of severe pain in the lower part of your abdomen.  You develop chills.  You run an unexplained temperature of 101 F (38.3 C) or higher.  You pass large clots or tissue. Save any tissue for your caregiver to inspect.  Your bleeding increases or you become light-headed, weak, or have fainting episodes.  You have a gush of fluid from your vagina.  You pass out. This could mean you have a tubal (ectopic) pregnancy. Document Released: 03/26/2005 Document Revised: 06/18/2011 Document Reviewed: 11/10/2007 [facility] Patient Information 2014 [place].

## 2013-09-02 NOTE — ED Notes (Signed)
On assessment patient states she had on a pad that had a large amount red blood with no clots. Patient states this first time she experience this amount of blood. Patient states this is her third pregnancy, which the first baby was delivered at 19 weeks. Patient has been seen at [facility].

## 2013-09-02 NOTE — ED Notes (Signed)
Pt is 10 weeks 5 days pregnant. Yesterday pt noticed spotting and brown vaginal discharge yesterday, called doctor and was told it was normal. Today about 20 minutes ago pt was driving, felt gush of blood come out that felt like blood clot coming out.

## 2013-09-03 NOTE — ED Provider Notes (Signed)
Medical screening examination/treatment/procedure(s) were performed by non-physician practitioner and as supervising physician I was immediately available for consultation/collaboration.   EKG Interpretation None        Tanna Furry, MD 09/03/13 1600

## 2013-10-06 ENCOUNTER — Ambulatory Visit (HOSPITAL_COMMUNITY): Payer: BC Managed Care – PPO

## 2013-10-23 ENCOUNTER — Ambulatory Visit (HOSPITAL_COMMUNITY): Admission: RE | Admit: 2013-10-23 | Payer: BC Managed Care – PPO | Source: Ambulatory Visit

## 2013-10-26 ENCOUNTER — Encounter (HOSPITAL_COMMUNITY): Payer: Self-pay | Admitting: *Deleted

## 2013-10-26 ENCOUNTER — Inpatient Hospital Stay (HOSPITAL_COMMUNITY)
Admission: AD | Admit: 2013-10-26 | Discharge: 2013-10-26 | Disposition: A | Discharge status: Home / Self Care | Payer: BC Managed Care – PPO | Source: Ambulatory Visit | Attending: Obstetrics and Gynecology | Admitting: Obstetrics and Gynecology

## 2013-10-26 ENCOUNTER — Inpatient Hospital Stay (HOSPITAL_COMMUNITY): Payer: BC Managed Care – PPO

## 2013-10-26 DIAGNOSIS — O3430 Maternal care for cervical incompetence, unspecified trimester: Secondary | ICD-10-CM | POA: Diagnosis not present

## 2013-10-26 DIAGNOSIS — O99891 Other specified diseases and conditions complicating pregnancy: Secondary | ICD-10-CM | POA: Diagnosis not present

## 2013-10-26 DIAGNOSIS — M545 Low back pain, unspecified: Secondary | ICD-10-CM | POA: Diagnosis not present

## 2013-10-26 DIAGNOSIS — E785 Hyperlipidemia, unspecified: Secondary | ICD-10-CM | POA: Insufficient documentation

## 2013-10-26 DIAGNOSIS — R109 Unspecified abdominal pain: Secondary | ICD-10-CM | POA: Insufficient documentation

## 2013-10-26 DIAGNOSIS — G8929 Other chronic pain: Secondary | ICD-10-CM | POA: Insufficient documentation

## 2013-10-26 DIAGNOSIS — N949 Unspecified condition associated with female genital organs and menstrual cycle: Secondary | ICD-10-CM | POA: Insufficient documentation

## 2013-10-26 DIAGNOSIS — K219 Gastro-esophageal reflux disease without esophagitis: Secondary | ICD-10-CM | POA: Insufficient documentation

## 2013-10-26 DIAGNOSIS — O09299 Supervision of pregnancy with other poor reproductive or obstetric history, unspecified trimester: Secondary | ICD-10-CM

## 2013-10-26 DIAGNOSIS — O9989 Other specified diseases and conditions complicating pregnancy, childbirth and the puerperium: Principal | ICD-10-CM

## 2013-10-26 LAB — URINALYSIS, ROUTINE W REFLEX MICROSCOPIC
BILIRUBIN URINE: NEGATIVE
Glucose, UA: NEGATIVE mg/dL
Hgb urine dipstick: NEGATIVE
Ketones, ur: NEGATIVE mg/dL
LEUKOCYTES UA: NEGATIVE
Nitrite: NEGATIVE
PH: 6 (ref 5.0–8.0)
Protein, ur: NEGATIVE mg/dL
Urobilinogen, UA: 0.2 mg/dL (ref 0.0–1.0)

## 2013-10-26 LAB — WET PREP, GENITAL
Clue Cells Wet Prep HPF POC: NONE SEEN
Trich, Wet Prep: NONE SEEN
YEAST WET PREP: NONE SEEN

## 2013-10-26 NOTE — MAU Provider Note (Signed)
Chief Complaint: Abdominal Cramping   First Provider Initiated Contact with Patient 10/26/13 2057     SUBJECTIVE HPI: Mikayla Humphrey is a 33 y.o. G3P1011 at [redacted]w[redacted]d by LMP who presents with 3 episodes of low abdominal and low back cramping in the past 2 days. Rates pain 3/10 on pain scale. Has not tried anything for the pain. 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 Call for OB/GYN and was instructed to come to maternity admissions for cervical length.   Past Medical History  Diagnosis Date  . Chronic pelvic pain in female 02/16/10  . 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 H/O hemorrhoids   . Abnormal Pap smear 2002  . Hx of bacterial infection   . Yeast infection   . BV (bacterial vaginosis) 05/07/06  . 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 Irregular periods/menstrual cycles 2008  . H/O fatigue 2008  . Candidal vaginitis 2009  . H/O dysmenorrhea 11/10/09  . Endometriosis 06/08/2010  . H/O amenorrhea 07/16/11  . H/O heartburn 07/16/2011  . Hyperlipidemia   . GERD (gastroesophageal reflux disease)    OB History  Gravida Para Term Preterm AB SAB TAB Ectopic Multiple Living  3 1 1  1 1    1     # Outcome Date GA Lbr Len/2nd Weight Sex Delivery Anes PTL Lv  3 CUR           2 TRM 2001    F LTCS   Y  1 SAB     U    N     Past Surgical History  Procedure Laterality Date  . Cesarean section    . Scare tissue     History   Social History  . Marital Status: Married    Spouse Name: N/A    Number of Children: N/A  . Years of Education: N/A   Occupational History  . Not on file.   Social History Main Topics  . Smoking status: Never Smoker   . Smokeless tobacco: Never Used  . Alcohol Use: No  . Drug Use: No  . Sexual Activity: Yes    Birth Control/ Protection: None     Comment: last tintercourse 7-10 ago   Other Topics Concern  . Not on file   Social History Narrative  . No narrative on file   No current facility-administered medications on  file prior to encounter.   No current outpatient prescriptions on file prior to encounter.   Allergies  Allergen Reactions  . Amoxicillin Hives and Rash    ROS: Pertinent positive items in HPI. Increased mildly malodorous white vaginal discharge. Negative for fever, chills, vaginal bleeding, nausea, vomiting, diarrhea, constipation, urinary complaints, flank pain.  OBJECTIVE Blood pressure 143/60, pulse 96, temperature 98.2 F (36.8 C), temperature source Oral, resp. rate 20. GENERAL: Well-developed, well-nourished female in no acute distress.  HEENT: Normocephalic HEART: normal rate RESP: normal effort ABDOMEN: Soft, non-tender.  EXTREMITIES: Nontender, no edema NEURO: Alert and oriented SPECULUM EXAM: NEFG, moderate amount of thick, white, odorless discharge, no blood noted, cervix clean BIMANUAL: cervix closed and long; uterus 19-week size, no adnexal tenderness or masses Fetal heart rate 150 by Doppler.  LAB RESULTS Results for orders placed during the hospital encounter of 10/26/13 (from the past 24 hour(s))  WET PREP, GENITAL     Status: Abnormal   Collection Time    10/26/13  9:00 PM      Result Value Ref Range  Yeast Wet Prep HPF POC NONE SEEN  NONE SEEN   Trich, Wet Prep NONE SEEN  NONE SEEN   Clue Cells Wet Prep HPF POC NONE SEEN  NONE SEEN   WBC, Wet Prep HPF POC FEW (*) NONE SEEN    IMAGING Cervical length 3.97 cm.  MAU COURSE Care of patient turned over to Marcille Buffy, CNM at 9:30 PM. 2152: D/W Dr. Gaetano Net he will come to see the patient prior to DC   ASSESSMENT History of incompetent cervix. Second trimester cramping  PLAN DC home PTL precautions  Return to MAU as needed  Follow-up Information   Follow up with [facility] [place] [place].. (As scheduled)    Contact information:   [place] [phone]       Heather Donovan Hogan, [place] 10/26/2013  9:43 PM

## 2013-10-26 NOTE — MAU Note (Signed)
Sent from office for [place] to check cervical length.  Hx of 19wk loss.  Cramping past 2 days.

## 2013-10-26 NOTE — Progress Notes (Signed)
D/W patient U/S  Will continue no heavy lifting, etc FU office for anatomy U/S in 2 days as scheduled

## 2013-10-26 NOTE — Discharge Instructions (Signed)
Second Trimester of Pregnancy The second trimester is from week 13 through week 28, months 4 through 6. The second trimester is often a time when you feel your best. Your body has also adjusted to being pregnant, and you begin to feel better physically. Usually, morning sickness has lessened or quit completely, you may have more energy, and you may have an increase in appetite. The second trimester is also a time when the fetus is growing rapidly. At the end of the sixth month, the fetus is about 9 inches long and weighs about 1 pounds. You will likely begin to feel the baby move (quickening) between 18 and 20 weeks of the pregnancy. BODY CHANGES Your body goes through many changes during pregnancy. The changes vary from woman to woman.   Your weight will continue to increase. You will notice your lower abdomen bulging out.  You may begin to get stretch marks on your hips, abdomen, and breasts.  You may develop headaches that can be relieved by medicines approved by your health care provider.  You may urinate more often because the fetus is pressing on your bladder.  You may develop or continue to have heartburn as a result of your pregnancy.  You may develop constipation because certain hormones are causing the muscles that push waste through your intestines to slow down.  You may develop hemorrhoids or swollen, bulging veins (varicose veins).  You may have back pain because of the weight gain and pregnancy hormones relaxing your joints between the bones in your pelvis and as a result of a shift in weight and the muscles that support your balance.  Your breasts will continue to grow and be tender.  Your gums may bleed and may be sensitive to brushing and flossing.  Dark spots or blotches (chloasma, mask of pregnancy) may develop on your face. This will likely fade after the baby is born.  A dark line from your belly button to the pubic area (linea nigra) may appear. This will likely fade  after the baby is born.  You may have changes in your hair. These can include thickening of your hair, rapid growth, and changes in texture. Some women also have hair loss during or after pregnancy, or hair that feels dry or thin. Your hair will most likely return to normal after your baby is born. WHAT TO EXPECT AT YOUR PRENATAL VISITS During a routine prenatal visit:  You will be weighed to make sure you and the fetus are growing normally.  Your blood pressure will be taken.  Your abdomen will be measured to track your baby's growth.  The fetal heartbeat will be listened to.  Any test results from the previous visit will be discussed. Your health care provider may ask you:  How you are feeling.  If you are feeling the baby move.  If you have had any abnormal symptoms, such as leaking fluid, bleeding, severe headaches, or abdominal cramping.  If you have any questions. Other tests that may be performed during your second trimester include:  Blood tests that check for:  Low iron levels (anemia).  Gestational diabetes (between 24 and 28 weeks).  Rh antibodies.  Urine tests to check for infections, diabetes, or protein in the urine.  An ultrasound to confirm the proper growth and development of the baby.  An amniocentesis to check for possible genetic problems.  Fetal screens for spina bifida and Down syndrome. HOME CARE INSTRUCTIONS   Avoid all smoking, herbs, alcohol, and unprescribed   drugs. These chemicals affect the formation and growth of the baby.  Follow your health care provider's instructions regarding medicine use. There are medicines that are either safe or unsafe to take during pregnancy.  Exercise only as directed by your health care provider. Experiencing uterine cramps is a good sign to stop exercising.  Continue to eat regular, healthy meals.  Wear a good support bra for breast tenderness.  Do not use hot tubs, steam rooms, or saunas.  Wear your  seat belt at all times when driving.  Avoid raw meat, uncooked cheese, cat litter boxes, and soil used by cats. These carry germs that can cause birth defects in the baby.  Take your prenatal vitamins.  Try taking a stool softener (if your health care provider approves) if you develop constipation. Eat more high-fiber foods, such as fresh vegetables or fruit and whole grains. Drink plenty of fluids to keep your urine clear or pale yellow.  Take warm sitz baths to soothe any pain or discomfort caused by hemorrhoids. Use hemorrhoid cream if your health care provider approves.  If you develop varicose veins, wear support hose. Elevate your feet for 15 minutes, 3-4 times a day. Limit salt in your diet.  Avoid heavy lifting, wear low heel shoes, and practice good posture.  Rest with your legs elevated if you have leg cramps or low back pain.  Visit your dentist if you have not gone yet during your pregnancy. Use a soft toothbrush to brush your teeth and be gentle when you floss.  A sexual relationship may be continued unless your health care provider directs you otherwise.  Continue to go to all your prenatal visits as directed by your health care provider. SEEK MEDICAL CARE IF:   You have dizziness.  You have mild pelvic cramps, pelvic pressure, or nagging pain in the abdominal area.  You have persistent nausea, vomiting, or diarrhea.  You have a bad smelling vaginal discharge.  You have pain with urination. SEEK IMMEDIATE MEDICAL CARE IF:   You have a fever.  You are leaking fluid from your vagina.  You have spotting or bleeding from your vagina.  You have severe abdominal cramping or pain.  You have rapid weight gain or loss.  You have shortness of breath with chest pain.  You notice sudden or extreme swelling of your face, hands, ankles, feet, or legs.  You have not felt your baby move in over an hour.  You have severe headaches that do not go away with  medicine.  You have vision changes. Document Released: 03/20/2001 Document Revised: 03/31/2013 Document Reviewed: 05/27/2012 [employer] Patient Information 2015 [employer]. This information is not intended to replace advice given to you by your health care provider. Make sure you discuss any questions you have with your health care provider.  

## 2013-10-27 LAB — GC/CHLAMYDIA PROBE AMP
CT Probe RNA: NEGATIVE
GC PROBE AMP APTIMA: NEGATIVE

## 2013-11-01 ENCOUNTER — Encounter: Payer: Self-pay | Admitting: Advanced Practice Midwife

## 2013-11-01 DIAGNOSIS — O09299 Supervision of pregnancy with other poor reproductive or obstetric history, unspecified trimester: Secondary | ICD-10-CM | POA: Insufficient documentation

## 2014-01-03 ENCOUNTER — Inpatient Hospital Stay (HOSPITAL_COMMUNITY)
Admission: AD | Admit: 2014-01-03 | Discharge: 2014-01-03 | Disposition: A | Discharge status: Home / Self Care | Payer: BC Managed Care – PPO | Source: Ambulatory Visit | Attending: Obstetrics and Gynecology | Admitting: Obstetrics and Gynecology

## 2014-01-03 ENCOUNTER — Encounter (HOSPITAL_COMMUNITY): Payer: Self-pay

## 2014-01-03 DIAGNOSIS — O09293 Supervision of pregnancy with other poor reproductive or obstetric history, third trimester: Secondary | ICD-10-CM

## 2014-01-03 DIAGNOSIS — O3430 Maternal care for cervical incompetence, unspecified trimester: Secondary | ICD-10-CM | POA: Diagnosis not present

## 2014-01-03 DIAGNOSIS — N898 Other specified noninflammatory disorders of vagina: Secondary | ICD-10-CM | POA: Diagnosis not present

## 2014-01-03 DIAGNOSIS — O99891 Other specified diseases and conditions complicating pregnancy: Secondary | ICD-10-CM | POA: Insufficient documentation

## 2014-01-03 DIAGNOSIS — O26893 Other specified pregnancy related conditions, third trimester: Secondary | ICD-10-CM

## 2014-01-03 DIAGNOSIS — O9989 Other specified diseases and conditions complicating pregnancy, childbirth and the puerperium: Secondary | ICD-10-CM

## 2014-01-03 LAB — WET PREP, GENITAL
CLUE CELLS WET PREP: NONE SEEN
Trich, Wet Prep: NONE SEEN
Yeast Wet Prep HPF POC: NONE SEEN

## 2014-01-03 LAB — URINALYSIS, ROUTINE W REFLEX MICROSCOPIC
BILIRUBIN URINE: NEGATIVE
GLUCOSE, UA: NEGATIVE mg/dL
Hgb urine dipstick: NEGATIVE
KETONES UR: 15 mg/dL — AB
Leukocytes, UA: NEGATIVE
Nitrite: NEGATIVE
PH: 6.5 (ref 5.0–8.0)
Protein, ur: NEGATIVE mg/dL
Specific Gravity, Urine: 1.01 (ref 1.005–1.030)
Urobilinogen, UA: 0.2 mg/dL (ref 0.0–1.0)

## 2014-01-03 NOTE — MAU Provider Note (Signed)
History     CSN: [id number]  Arrival date and time: 01/03/14 2111   First Provider Initiated Contact with Patient 01/03/14 2204      Chief Complaint  Patient presents with  . Vaginal Discharge  . Abdominal Pain   HPI Mikayla Humphrey is a 33 y.o. a G3P1011 at [redacted]w[redacted]d who presents with brown vaginal discharge that started about two hours ago. She also has had some vulvar burning that started at that time. She denies any pain or contractions. She denies any complications with the pregnancy. She denies any recent intercourse. She denies any history of preterm delivery. She did have a SAB at 19 weeks in 1999. She had a pregnancy after that, and that pregnancy went to term. She did not have a cerclage or need progesterone.   Past Medical History  Diagnosis Date  . Chronic pelvic pain in female 02/16/10  . 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 H/O hemorrhoids   . Abnormal Pap smear 2002  . Hx of bacterial infection   . Yeast infection   . BV (bacterial vaginosis) 05/07/06  . 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 Irregular periods/menstrual cycles 2008  . H/O fatigue 2008  . Candidal vaginitis 2009  . H/O dysmenorrhea 11/10/09  . Endometriosis 06/08/2010  . H/O amenorrhea 07/16/11  . H/O heartburn 07/16/2011  . Hyperlipidemia   . GERD (gastroesophageal reflux disease)     Past Surgical History  Procedure Laterality Date  . Cesarean section    . Scare tissue      History reviewed. No pertinent family history.  History  Substance Use Topics  . Smoking status: Never Smoker   . Smokeless tobacco: Never Used  . Alcohol Use: No    Allergies:  Allergies  Allergen Reactions  . Amoxicillin Hives and Rash    Prescriptions prior to admission  Medication Sig Dispense Refill  . ferrous sulfate 325 (65 FE) MG tablet Take 325 mg by mouth daily.      . Prenatal Vit-Fe Fumarate-FA (PRENATAL MULTIVITAMIN) TABS tablet Take 1 tablet by mouth daily.         ROS Physical Exam    Blood pressure 139/77, pulse 104, temperature 98 F (36.7 C), temperature source Oral, resp. rate 18.  Physical Exam  Nursing note and vitals reviewed. Constitutional: She is oriented to person, place, and time. She appears well-developed and well-nourished.  Cardiovascular: Normal rate.   Respiratory: Effort normal.  GI: Soft. There is no tenderness. There is no rebound.  Genitourinary:   External: no lesion Vagina: large amount of white discharge. No brown discharge seen Cervix: pink, smooth, no CMT. Closed/thick/high/posterior  Uterus: AGA   Neurological: She is alert and oriented to person, place, and time.  Skin: Skin is warm and dry.  Psychiatric: She has a normal mood and affect.   FHT 142, moderate with 15x15 accels, one isolated variable. Toco: no UCs  MAU Course  Procedures Results for orders placed during the hospital encounter of 01/03/14 (from the past 24 hour(s))  URINALYSIS, ROUTINE W REFLEX MICROSCOPIC     Status: Abnormal   Collection Time    01/03/14  9:20 PM      Result Value Ref Range   Color, Urine YELLOW  YELLOW   APPearance CLEAR  CLEAR   Specific Gravity, Urine 1.010  1.005 - 1.030   pH 6.5  5.0 - 8.0   Glucose, UA NEGATIVE  NEGATIVE mg/dL   Hgb urine dipstick NEGATIVE  NEGATIVE   Bilirubin Urine NEGATIVE  NEGATIVE   Ketones, ur 15 (*) NEGATIVE mg/dL   Protein, ur NEGATIVE  NEGATIVE mg/dL   Urobilinogen, UA 0.2  0.0 - 1.0 mg/dL   Nitrite NEGATIVE  NEGATIVE   Leukocytes, UA NEGATIVE  NEGATIVE  WET PREP, GENITAL     Status: Abnormal   Collection Time    01/03/14 10:05 PM      Result Value Ref Range   Yeast Wet Prep HPF POC NONE SEEN  NONE SEEN   Trich, Wet Prep NONE SEEN  NONE SEEN   Clue Cells Wet Prep HPF POC NONE SEEN  NONE SEEN   WBC, Wet Prep HPF POC FEW (*) NONE SEEN  2230: D/W Dr. Radene Knee, ok for dc home at this time.   Assessment and Plan   1. H/O incompetent cervix, currently pregnant, third trimester   2. Vaginal discharge during  pregnancy in third trimester    PTL precautions Fetal kick counts Return to MAU as needed  Follow-up Information   Follow up with Darlyn Chamber, MD. (As scheduled)    Specialty:  Obstetrics and Gynecology   Contact information:   [address], [address] [place], [address] [place] [address] [phone]        Mathis Bud 01/03/2014, 10:30 PM

## 2014-01-03 NOTE — MAU Note (Signed)
Pt presents complaining of brown discharge that filled up a panty liner tonight. States she is having increased pressure and burning in her vagina. States the discharge has an odor to it. Reports good fetal movement.

## 2014-01-03 NOTE — Discharge Instructions (Signed)
Third Trimester of Pregnancy The third trimester is from week 29 through week 42, months 7 through 9. The third trimester is a time when the fetus is growing rapidly. At the end of the ninth month, the fetus is about 20 inches in length and weighs 6-10 pounds.  BODY CHANGES Your body goes through many changes during pregnancy. The changes vary from woman to woman.   Your weight will continue to increase. You can expect to gain 25-35 pounds (11-16 kg) by the end of the pregnancy.  You may begin to get stretch marks on your hips, abdomen, and breasts.  You may urinate more often because the fetus is moving lower into your pelvis and pressing on your bladder.  You may develop or continue to have heartburn as a result of your pregnancy.  You may develop constipation because certain hormones are causing the muscles that push waste through your intestines to slow down.  You may develop hemorrhoids or swollen, bulging veins (varicose veins).  You may have pelvic pain because of the weight gain and pregnancy hormones relaxing your joints between the bones in your pelvis. Backaches may result from overexertion of the muscles supporting your posture.  You may have changes in your hair. These can include thickening of your hair, rapid growth, and changes in texture. Some women also have hair loss during or after pregnancy, or hair that feels dry or thin. Your hair will most likely return to normal after your baby is born.  Your breasts will continue to grow and be tender. A yellow discharge may leak from your breasts called colostrum.  Your belly button may stick out.  You may feel short of breath because of your expanding uterus.  You may notice the fetus "dropping," or moving lower in your abdomen.  You may have a bloody mucus discharge. This usually occurs a few days to a week before labor begins.  Your cervix becomes thin and soft (effaced) near your due date. WHAT TO EXPECT AT YOUR PRENATAL  EXAMS  You will have prenatal exams every 2 weeks until week 36. Then, you will have weekly prenatal exams. During a routine prenatal visit:  You will be weighed to make sure you and the fetus are growing normally.  Your blood pressure is taken.  Your abdomen will be measured to track your baby's growth.  The fetal heartbeat will be listened to.  Any test results from the previous visit will be discussed.  You may have a cervical check near your due date to see if you have effaced. At around 36 weeks, your caregiver will check your cervix. At the same time, your caregiver will also perform a test on the secretions of the vaginal tissue. This test is to determine if a type of bacteria, Group B streptococcus, is present. Your caregiver will explain this further. Your caregiver may ask you:  What your birth plan is.  How you are feeling.  If you are feeling the baby move.  If you have had any abnormal symptoms, such as leaking fluid, bleeding, severe headaches, or abdominal cramping.  If you have any questions. Other tests or screenings that may be performed during your third trimester include:  Blood tests that check for low iron levels (anemia).  Fetal testing to check the health, activity level, and growth of the fetus. Testing is done if you have certain medical conditions or if there are problems during the pregnancy. FALSE LABOR You may feel small, irregular contractions that   eventually go away. These are called Braxton Hicks contractions, or false labor. Contractions may last for hours, days, or even weeks before true labor sets in. If contractions come at regular intervals, intensify, or become painful, it is best to be seen by your caregiver.  SIGNS OF LABOR   Menstrual-like cramps.  Contractions that are 5 minutes apart or less.  Contractions that start on the top of the uterus and spread down to the lower abdomen and back.  A sense of increased pelvic pressure or back  pain.  A watery or bloody mucus discharge that comes from the vagina. If you have any of these signs before the 37th week of pregnancy, call your caregiver right away. You need to go to the hospital to get checked immediately. HOME CARE INSTRUCTIONS   Avoid all smoking, herbs, alcohol, and unprescribed drugs. These chemicals affect the formation and growth of the baby.  Follow your caregiver's instructions regarding medicine use. There are medicines that are either safe or unsafe to take during pregnancy.  Exercise only as directed by your caregiver. Experiencing uterine cramps is a good sign to stop exercising.  Continue to eat regular, healthy meals.  Wear a good support bra for breast tenderness.  Do not use hot tubs, steam rooms, or saunas.  Wear your seat belt at all times when driving.  Avoid raw meat, uncooked cheese, cat litter boxes, and soil used by cats. These carry germs that can cause birth defects in the baby.  Take your prenatal vitamins.  Try taking a stool softener (if your caregiver approves) if you develop constipation. Eat more high-fiber foods, such as fresh vegetables or fruit and whole grains. Drink plenty of fluids to keep your urine clear or pale yellow.  Take warm sitz baths to soothe any pain or discomfort caused by hemorrhoids. Use hemorrhoid cream if your caregiver approves.  If you develop varicose veins, wear support hose. Elevate your feet for 15 minutes, 3-4 times a day. Limit salt in your diet.  Avoid heavy lifting, wear low heal shoes, and practice good posture.  Rest a lot with your legs elevated if you have leg cramps or low back pain.  Visit your dentist if you have not gone during your pregnancy. Use a soft toothbrush to brush your teeth and be gentle when you floss.  A sexual relationship may be continued unless your caregiver directs you otherwise.  Do not travel far distances unless it is absolutely necessary and only with the approval  of your caregiver.  Take prenatal classes to understand, practice, and ask questions about the labor and delivery.  Make a trial run to the hospital.  Pack your hospital bag.  Prepare the baby's nursery.  Continue to go to all your prenatal visits as directed by your caregiver. SEEK MEDICAL CARE IF:  You are unsure if you are in labor or if your water has broken.  You have dizziness.  You have mild pelvic cramps, pelvic pressure, or nagging pain in your abdominal area.  You have persistent nausea, vomiting, or diarrhea.  You have a bad smelling vaginal discharge.  You have pain with urination. SEEK IMMEDIATE MEDICAL CARE IF:   You have a fever.  You are leaking fluid from your vagina.  You have spotting or bleeding from your vagina.  You have severe abdominal cramping or pain.  You have rapid weight loss or gain.  You have shortness of breath with chest pain.  You notice sudden or extreme swelling   of your face, hands, ankles, feet, or legs.  You have not felt your baby move in over an hour.  You have severe headaches that do not go away with medicine.  You have vision changes. Document Released: 03/20/2001 Document Revised: 03/31/2013 Document Reviewed: 05/27/2012 ExitCare Patient Information 2015 [employer]. This information is not intended to replace advice given to you by your health care provider. Make sure you discuss any questions you have with your health care provider.  

## 2014-02-08 ENCOUNTER — Encounter (HOSPITAL_COMMUNITY): Payer: Self-pay

## 2014-02-24 ENCOUNTER — Other Ambulatory Visit (INDEPENDENT_AMBULATORY_CARE_PROVIDER_SITE_OTHER): Payer: Self-pay | Admitting: General Surgery

## 2014-03-18 ENCOUNTER — Encounter (HOSPITAL_COMMUNITY): Payer: Self-pay

## 2014-03-18 ENCOUNTER — Encounter (HOSPITAL_COMMUNITY)
Admission: RE | Admit: 2014-03-18 | Discharge: 2014-03-18 | Disposition: A | Discharge status: Home / Self Care | Payer: BC Managed Care – PPO | Source: Ambulatory Visit | Attending: Obstetrics and Gynecology | Admitting: Obstetrics and Gynecology

## 2014-03-18 HISTORY — DX: Heartburn: R12

## 2014-03-18 HISTORY — DX: Other specified pregnancy related conditions, unspecified trimester: O26.899

## 2014-03-18 HISTORY — DX: Anemia, unspecified: D64.9

## 2014-03-18 HISTORY — DX: Personal history of other diseases of the female genital tract: Z87.42

## 2014-03-18 LAB — RPR

## 2014-03-18 LAB — CBC
HEMATOCRIT: 34.8 % — AB (ref 36.0–46.0)
Hemoglobin: 11 g/dL — ABNORMAL LOW (ref 12.0–15.0)
MCH: 26.7 pg (ref 26.0–34.0)
MCHC: 31.6 g/dL (ref 30.0–36.0)
MCV: 84.5 fL (ref 78.0–100.0)
Platelets: 135 10*3/uL — ABNORMAL LOW (ref 150–400)
RBC: 4.12 MIL/uL (ref 3.87–5.11)
RDW: 17.3 % — ABNORMAL HIGH (ref 11.5–15.5)
WBC: 7.1 10*3/uL (ref 4.0–10.5)

## 2014-03-18 LAB — ABO/RH: ABO/RH(D): A POS

## 2014-03-18 MED ORDER — GENTAMICIN SULFATE 40 MG/ML IJ SOLN
INTRAMUSCULAR | Status: AC
  Administered 2014-03-19: 100 mL via INTRAVENOUS
  Filled 2014-03-18: qty 11.25

## 2014-03-18 NOTE — Pre-Procedure Instructions (Signed)
Dr Lyndle Herrlich informed of platelet count of 135 at today's PAT appt.  No orders given.  [facility] for surgery.

## 2014-03-18 NOTE — H&P (Signed)
Mikayla Humphrey is a 33 y.o. female presenting for repeat cesarean section. U/S @ 36 4/7 weeks EFW 7# 11oz (93%). Hx HSV. Hx of midtrimester loss-MFM consult rec: serial U/S and NST. Echogenic focus in left fetal ventricle noted. Maternal Medical History:  Fetal activity: Perceived fetal activity is normal.      OB History    Gravida Para Term Preterm AB TAB SAB Ectopic Multiple Living   3 2 1 1  0  0   1     Past Medical History  Diagnosis Date  . Chronic pelvic pain in female 02/16/10  . 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 H/O hemorrhoids   . Abnormal Pap smear 2002  . Hx of bacterial infection   . Yeast infection   . BV (bacterial vaginosis) 05/07/06  . 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 Irregular periods/menstrual cycles 2008  . H/O fatigue 2008  . Candidal vaginitis 2009  . H/O dysmenorrhea 11/10/09  . Endometriosis 06/08/2010  . H/O amenorrhea 07/16/11  . H/O heartburn 07/16/2011  . Hyperlipidemia     diet controlled, no meds  . H/O infertility     IUI pregnancy  . Anemia   . Heartburn in pregnancy    Past Surgical History  Procedure Laterality Date  . Cesarean section  2001    [facility]  . Scare tissue      from c/s incision  . Diagnostic laparoscopy  2011    endometriosis  . Wisdom tooth extraction     Family History: family history is not on file. Social History:  reports that she has never smoked. She has never used smokeless tobacco. She reports that she does not drink alcohol or use illicit drugs.   Prenatal Transfer Tool  Maternal Diabetes: No Genetic Screening: Normal Maternal Ultrasounds/Referrals: Normal Fetal Ultrasounds or other Referrals:  Referred to Materal Fetal Medicine  Maternal Substance Abuse:  No Significant Maternal Medications:  None Significant Maternal Lab Results:  None Other Comments:  None  Review of Systems  Eyes: Negative for blurred vision.  Gastrointestinal: Negative for abdominal pain.  Neurological: Negative  for headaches.      There were no vitals taken for this visit. Exam Physical Exam  Cardiovascular: Normal rate and regular rhythm.   Respiratory: Effort normal.  GI: Soft.  Neurological: She has normal reflexes.    Prenatal labs: ABO, Rh: --/--/A POS (12/10 1145) Antibody: PENDING (12/10 1145) Rubella: Immune (05/11 0000) RPR: Nonreactive (05/11 0000)  HBsAg:    HIV: Non-reactive (05/11 0000)  GBS:     Assessment/Plan: 33 yo G3P1 @ 39 weeks with history of cesarean section elects repeat.  Risks reviewed preoperatively including infection, organ damage, bleeding/transfusion-HIV/Hep, DVT/PE, pneumonia. All questions answered.   Luisantonio Adinolfi II,Keithan Dileonardo E 03/18/2014, 12:58 PM

## 2014-03-18 NOTE — Patient Instructions (Addendum)
   Your procedure is scheduled on: Friday, Dec 11  Enter through the [employer] [facility] at: 6 AM Pick up the phone at the desk and dial [phone] and inform us of your arrival.  Please call this number if you have any problems the morning of surgery: [phone]  Remember: Do not eat or drink after midnight: tonight Take these medicines the morning of surgery with a SIP OF WATER:  valtrex  Do not wear jewelry, make-up, or FINGER nail polish No metal in your hair or on your body. Do not wear lotions, powders, perfumes.  You may wear deodorant.  Do not bring valuables to the hospital. Contacts, dentures or bridgework may not be worn into surgery.  Leave suitcase in the car. After Surgery it may be brought to your room. For patients being admitted to the hospital, checkout time is 11:00am the day of discharge.  Home with husband Mikayla Humphrey cell [phone]

## 2014-03-19 ENCOUNTER — Inpatient Hospital Stay (HOSPITAL_COMMUNITY): Payer: BC Managed Care – PPO | Admitting: Anesthesiology

## 2014-03-19 ENCOUNTER — Encounter (HOSPITAL_COMMUNITY)
Admission: RE | Disposition: A | Discharge status: Home / Self Care | Payer: Self-pay | Source: Ambulatory Visit | Attending: Obstetrics and Gynecology

## 2014-03-19 ENCOUNTER — Encounter (HOSPITAL_COMMUNITY): Payer: Self-pay | Admitting: Anesthesiology

## 2014-03-19 ENCOUNTER — Inpatient Hospital Stay (HOSPITAL_COMMUNITY)
Admission: RE | Admit: 2014-03-19 | Discharge: 2014-03-21 | DRG: 765 | Disposition: A | Discharge status: Home / Self Care | Payer: BC Managed Care – PPO | Source: Ambulatory Visit | Attending: Obstetrics and Gynecology | Admitting: Obstetrics and Gynecology

## 2014-03-19 DIAGNOSIS — N643 Galactorrhea not associated with childbirth: Secondary | ICD-10-CM

## 2014-03-19 DIAGNOSIS — N809 Endometriosis, unspecified: Secondary | ICD-10-CM

## 2014-03-19 DIAGNOSIS — Z3A36 36 weeks gestation of pregnancy: Secondary | ICD-10-CM | POA: Diagnosis present

## 2014-03-19 DIAGNOSIS — Z6841 Body Mass Index (BMI) 40.0 and over, adult: Secondary | ICD-10-CM | POA: Diagnosis not present

## 2014-03-19 DIAGNOSIS — Z98891 History of uterine scar from previous surgery: Secondary | ICD-10-CM

## 2014-03-19 DIAGNOSIS — O99214 Obesity complicating childbirth: Secondary | ICD-10-CM | POA: Diagnosis present

## 2014-03-19 DIAGNOSIS — O09291 Supervision of pregnancy with other poor reproductive or obstetric history, first trimester: Secondary | ICD-10-CM

## 2014-03-19 DIAGNOSIS — O3421 Maternal care for scar from previous cesarean delivery: Principal | ICD-10-CM | POA: Diagnosis present

## 2014-03-19 DIAGNOSIS — K602 Anal fissure, unspecified: Secondary | ICD-10-CM

## 2014-03-19 LAB — PREPARE RBC (CROSSMATCH)

## 2014-03-19 SURGERY — Surgical Case
Anesthesia: Spinal | Site: Abdomen

## 2014-03-19 MED ORDER — SENNOSIDES-DOCUSATE SODIUM 8.6-50 MG PO TABS
2.0000 | ORAL_TABLET | ORAL | Status: DC
  Administered 2014-03-19 – 2014-03-20 (×2): 2 via ORAL
  Filled 2014-03-19 (×2): qty 2

## 2014-03-19 MED ORDER — DIBUCAINE 1 % RE OINT: 1.0000 "application " | TOPICAL_OINTMENT | RECTAL | Status: DC | PRN

## 2014-03-19 MED ORDER — FENTANYL CITRATE 0.05 MG/ML IJ SOLN
INTRAMUSCULAR | Status: DC | PRN
  Administered 2014-03-19: 25 ug via INTRATHECAL

## 2014-03-19 MED ORDER — ONDANSETRON HCL 4 MG/2ML IJ SOLN: 4.0000 mg | Freq: Three times a day (TID) | INTRAMUSCULAR | Status: DC | PRN

## 2014-03-19 MED ORDER — NALBUPHINE HCL 10 MG/ML IJ SOLN: 5.0000 mg | Freq: Once | INTRAMUSCULAR | Status: AC | PRN

## 2014-03-19 MED ORDER — SODIUM CHLORIDE 0.9 % IJ SOLN
INTRAMUSCULAR | Status: AC
Start: 2014-03-19 — End: 2014-03-19
  Filled 2014-03-19: qty 20

## 2014-03-19 MED ORDER — MEPERIDINE HCL 25 MG/ML IJ SOLN: 6.2500 mg | INTRAMUSCULAR | Status: DC | PRN

## 2014-03-19 MED ORDER — LACTATED RINGERS IV SOLN
INTRAVENOUS | Status: DC
  Administered 2014-03-19 (×2): via INTRAVENOUS

## 2014-03-19 MED ORDER — NALBUPHINE HCL 10 MG/ML IJ SOLN: 5.0000 mg | INTRAMUSCULAR | Status: DC | PRN

## 2014-03-19 MED ORDER — OXYTOCIN 10 UNIT/ML IJ SOLN
INTRAMUSCULAR | Status: AC
  Filled 2014-03-19: qty 4

## 2014-03-19 MED ORDER — DIPHENHYDRAMINE HCL 25 MG PO CAPS
25.0000 mg | ORAL_CAPSULE | ORAL | Status: DC | PRN
  Administered 2014-03-19: 25 mg via ORAL
  Filled 2014-03-19: qty 1

## 2014-03-19 MED ORDER — 0.9 % SODIUM CHLORIDE (POUR BTL) OPTIME
TOPICAL | Status: DC | PRN
  Administered 2014-03-19: 1000 mL

## 2014-03-19 MED ORDER — PRENATAL MULTIVITAMIN CH
1.0000 | ORAL_TABLET | Freq: Every day | ORAL | Status: DC
  Administered 2014-03-20 – 2014-03-21 (×2): 1 via ORAL
  Filled 2014-03-19 (×2): qty 1

## 2014-03-19 MED ORDER — PHENYLEPHRINE HCL 10 MG/ML IJ SOLN
INTRAMUSCULAR | Status: DC | PRN
  Administered 2014-03-19: 80 ug via INTRAVENOUS
  Administered 2014-03-19: 40 ug via INTRAVENOUS

## 2014-03-19 MED ORDER — TETANUS-DIPHTH-ACELL PERTUSSIS 5-2.5-18.5 LF-MCG/0.5 IM SUSP: 0.5000 mL | Freq: Once | INTRAMUSCULAR | Status: DC

## 2014-03-19 MED ORDER — OXYCODONE-ACETAMINOPHEN 5-325 MG PO TABS
1.0000 | ORAL_TABLET | ORAL | Status: DC | PRN
  Administered 2014-03-20 – 2014-03-21 (×3): 1 via ORAL
  Filled 2014-03-19 (×3): qty 1

## 2014-03-19 MED ORDER — WITCH HAZEL-GLYCERIN EX PADS: 1.0000 "application " | MEDICATED_PAD | CUTANEOUS | Status: DC | PRN

## 2014-03-19 MED ORDER — LACTATED RINGERS IV SOLN
Freq: Once | INTRAVENOUS | Status: AC
  Administered 2014-03-19: 06:00:00 via INTRAVENOUS

## 2014-03-19 MED ORDER — NALOXONE HCL 0.4 MG/ML IJ SOLN: 0.4000 mg | INTRAMUSCULAR | Status: DC | PRN

## 2014-03-19 MED ORDER — SCOPOLAMINE 1 MG/3DAYS TD PT72
1.0000 | MEDICATED_PATCH | Freq: Once | TRANSDERMAL | Status: DC
  Administered 2014-03-19: 1.5 mg via TRANSDERMAL

## 2014-03-19 MED ORDER — PHENYLEPHRINE 8 MG IN D5W 100 ML (0.08MG/ML) PREMIX OPTIME
INJECTION | INTRAVENOUS | Status: AC
  Filled 2014-03-19: qty 100

## 2014-03-19 MED ORDER — OXYCODONE-ACETAMINOPHEN 5-325 MG PO TABS: 2.0000 | ORAL_TABLET | ORAL | Status: DC | PRN

## 2014-03-19 MED ORDER — NALOXONE HCL 1 MG/ML IJ SOLN
1.0000 ug/kg/h | INTRAVENOUS | Status: DC | PRN
  Filled 2014-03-19: qty 2

## 2014-03-19 MED ORDER — DIPHENHYDRAMINE HCL 25 MG PO CAPS: 25.0000 mg | ORAL_CAPSULE | Freq: Four times a day (QID) | ORAL | Status: DC | PRN

## 2014-03-19 MED ORDER — PHENYLEPHRINE 40 MCG/ML (10ML) SYRINGE FOR IV PUSH (FOR BLOOD PRESSURE SUPPORT)
PREFILLED_SYRINGE | INTRAVENOUS | Status: AC
  Filled 2014-03-19: qty 5

## 2014-03-19 MED ORDER — SCOPOLAMINE 1 MG/3DAYS TD PT72
MEDICATED_PATCH | TRANSDERMAL | Status: AC
  Administered 2014-03-19: 1.5 mg via TRANSDERMAL
  Filled 2014-03-19: qty 1

## 2014-03-19 MED ORDER — SIMETHICONE 80 MG PO CHEW
80.0000 mg | CHEWABLE_TABLET | ORAL | Status: DC
  Administered 2014-03-19 – 2014-03-20 (×2): 80 mg via ORAL
  Filled 2014-03-19 (×2): qty 1

## 2014-03-19 MED ORDER — LACTATED RINGERS IV SOLN
INTRAVENOUS | Status: DC | PRN
  Administered 2014-03-19: 08:00:00 via INTRAVENOUS

## 2014-03-19 MED ORDER — BUPIVACAINE LIPOSOME 1.3 % IJ SUSP
20.0000 mL | Freq: Once | INTRAMUSCULAR | Status: AC
  Administered 2014-03-19: 20 mL
  Filled 2014-03-19: qty 20

## 2014-03-19 MED ORDER — ONDANSETRON HCL 4 MG/2ML IJ SOLN
INTRAMUSCULAR | Status: AC
  Filled 2014-03-19: qty 2

## 2014-03-19 MED ORDER — FENTANYL CITRATE 0.05 MG/ML IJ SOLN
INTRAMUSCULAR | Status: AC
  Filled 2014-03-19: qty 2

## 2014-03-19 MED ORDER — SIMETHICONE 80 MG PO CHEW
80.0000 mg | CHEWABLE_TABLET | Freq: Three times a day (TID) | ORAL | Status: DC
  Administered 2014-03-20 – 2014-03-21 (×4): 80 mg via ORAL
  Filled 2014-03-19 (×4): qty 1

## 2014-03-19 MED ORDER — ONDANSETRON HCL 4 MG/2ML IJ SOLN
INTRAMUSCULAR | Status: DC | PRN
  Administered 2014-03-19: 4 mg via INTRAVENOUS

## 2014-03-19 MED ORDER — OXYTOCIN 10 UNIT/ML IJ SOLN
40.0000 [IU] | INTRAMUSCULAR | Status: DC | PRN
  Administered 2014-03-19: 40 [IU] via INTRAVENOUS

## 2014-03-19 MED ORDER — ZOLPIDEM TARTRATE 5 MG PO TABS: 5.0000 mg | ORAL_TABLET | Freq: Every evening | ORAL | Status: DC | PRN

## 2014-03-19 MED ORDER — IBUPROFEN 600 MG PO TABS
600.0000 mg | ORAL_TABLET | Freq: Four times a day (QID) | ORAL | Status: DC
  Administered 2014-03-19 – 2014-03-21 (×8): 600 mg via ORAL
  Filled 2014-03-19 (×8): qty 1

## 2014-03-19 MED ORDER — OXYTOCIN 40 UNITS IN LACTATED RINGERS INFUSION - SIMPLE MED: 62.5000 mL/h | INTRAVENOUS | Status: AC

## 2014-03-19 MED ORDER — DIPHENHYDRAMINE HCL 50 MG/ML IJ SOLN: 12.5000 mg | INTRAMUSCULAR | Status: DC | PRN

## 2014-03-19 MED ORDER — MORPHINE SULFATE (PF) 0.5 MG/ML IJ SOLN
INTRAMUSCULAR | Status: DC | PRN
  Administered 2014-03-19: .1 ug via INTRATHECAL

## 2014-03-19 MED ORDER — MENTHOL 3 MG MT LOZG: 1.0000 | LOZENGE | OROMUCOSAL | Status: DC | PRN

## 2014-03-19 MED ORDER — ONDANSETRON HCL 4 MG/2ML IJ SOLN: 4.0000 mg | INTRAMUSCULAR | Status: DC | PRN

## 2014-03-19 MED ORDER — SIMETHICONE 80 MG PO CHEW: 80.0000 mg | CHEWABLE_TABLET | ORAL | Status: DC | PRN

## 2014-03-19 MED ORDER — SODIUM CHLORIDE 0.9 % IJ SOLN
INTRAMUSCULAR | Status: DC | PRN
  Administered 2014-03-19: 20 mL

## 2014-03-19 MED ORDER — SCOPOLAMINE 1 MG/3DAYS TD PT72
1.0000 | MEDICATED_PATCH | Freq: Once | TRANSDERMAL | Status: DC
  Filled 2014-03-19: qty 1

## 2014-03-19 MED ORDER — PHENYLEPHRINE 8 MG IN D5W 100 ML (0.08MG/ML) PREMIX OPTIME
INJECTION | INTRAVENOUS | Status: DC | PRN
  Administered 2014-03-19: 60 ug/min via INTRAVENOUS

## 2014-03-19 MED ORDER — LACTATED RINGERS IV SOLN: INTRAVENOUS | Status: DC

## 2014-03-19 MED ORDER — LANOLIN HYDROUS EX OINT: 1.0000 "application " | TOPICAL_OINTMENT | CUTANEOUS | Status: DC | PRN

## 2014-03-19 MED ORDER — MORPHINE SULFATE 0.5 MG/ML IJ SOLN
INTRAMUSCULAR | Status: AC
  Filled 2014-03-19: qty 10

## 2014-03-19 MED ORDER — ONDANSETRON HCL 4 MG PO TABS: 4.0000 mg | ORAL_TABLET | ORAL | Status: DC | PRN

## 2014-03-19 MED ORDER — SODIUM CHLORIDE 0.9 % IJ SOLN: 3.0000 mL | INTRAMUSCULAR | Status: DC | PRN

## 2014-03-19 SURGICAL SUPPLY — 36 items
BENZOIN TINCTURE PRP APPL 2/3 (GAUZE/BANDAGES/DRESSINGS) ×2 IMPLANT
CLAMP CORD UMBIL (MISCELLANEOUS) IMPLANT
CLOTH BEACON ORANGE TIMEOUT ST (SAFETY) ×2 IMPLANT
CONTAINER PREFILL 10% NBF 15ML (MISCELLANEOUS) IMPLANT
DRAPE SHEET LG 3/4 BI-LAMINATE (DRAPES) IMPLANT
DRSG OPSITE POSTOP 4X10 (GAUZE/BANDAGES/DRESSINGS) ×2 IMPLANT
DURAPREP 26ML APPLICATOR (WOUND CARE) ×2 IMPLANT
ELECT REM PT RETURN 9FT ADLT (ELECTROSURGICAL) ×2
ELECTRODE REM PT RTRN 9FT ADLT (ELECTROSURGICAL) ×1 IMPLANT
EXTRACTOR VACUUM M CUP 4 TUBE (SUCTIONS) IMPLANT
GLOVE BIO SURGEON STRL SZ7.5 (GLOVE) ×2 IMPLANT
GOWN STRL REUS W/TWL LRG LVL3 (GOWN DISPOSABLE) ×4 IMPLANT
KIT ABG SYR 3ML LUER SLIP (SYRINGE) ×2 IMPLANT
LIQUID BAND (GAUZE/BANDAGES/DRESSINGS) IMPLANT
NEEDLE HYPO 21X1.5 SAFETY (NEEDLE) ×2 IMPLANT
NEEDLE HYPO 25X5/8 SAFETYGLIDE (NEEDLE) ×2 IMPLANT
NS IRRIG 1000ML POUR BTL (IV SOLUTION) ×2 IMPLANT
PACK C SECTION WH (CUSTOM PROCEDURE TRAY) ×2 IMPLANT
PAD ABD 7.5X8 STRL (GAUZE/BANDAGES/DRESSINGS) ×2 IMPLANT
PAD OB MATERNITY 4.3X12.25 (PERSONAL CARE ITEMS) ×2 IMPLANT
SPONGE GAUZE 4X4 12PLY STER LF (GAUZE/BANDAGES/DRESSINGS) ×4 IMPLANT
STAPLER VISISTAT 35W (STAPLE) IMPLANT
STRIP CLOSURE SKIN 1/2X4 (GAUZE/BANDAGES/DRESSINGS) ×2 IMPLANT
SUT CHROMIC 2 0 SH (SUTURE) ×2 IMPLANT
SUT MNCRL 0 VIOLET CTX 36 (SUTURE) ×4 IMPLANT
SUT MONOCRYL 0 CTX 36 (SUTURE) ×4
SUT PDS AB 0 CTX 60 (SUTURE) ×2 IMPLANT
SUT PLAIN 0 NONE (SUTURE) IMPLANT
SUT PLAIN 2 0 (SUTURE) ×1
SUT PLAIN 2 0 XLH (SUTURE) IMPLANT
SUT PLAIN ABS 2-0 CT1 27XMFL (SUTURE) ×1 IMPLANT
SUT VIC AB 4-0 KS 27 (SUTURE) ×2 IMPLANT
SYR 20CC LL (SYRINGE) ×2 IMPLANT
TOWEL OR 17X24 6PK STRL BLUE (TOWEL DISPOSABLE) ×2 IMPLANT
TRAY FOLEY CATH 14FR (SET/KITS/TRAYS/PACK) ×2 IMPLANT
WATER STERILE IRR 1000ML POUR (IV SOLUTION) ×2 IMPLANT

## 2014-03-19 NOTE — Op Note (Signed)
Mikayla Humphrey, Mikayla Humphrey             ACCOUNT NO.:  [id number]  MEDICAL RECORD NO.:  [id number]  LOCATION:  [facility]                          FACILITY:  [facility]  PHYSICIAN:  Daleen Bo. Gaetano Net, M.D. DATE OF BIRTH:  November 27, 1980  DATE OF PROCEDURE:  03/19/2014 DATE OF DISCHARGE:                              OPERATIVE REPORT   PREOPERATIVE DIAGNOSIS:  Previous cesarean section, desires repeat.  POSTOPERATIVE DIAGNOSIS:  Previous cesarean section, desires repeat.  PROCEDURE:  Repeat low transverse cesarean section.  SURGEON:  Daleen Bo. Gaetano Net, M.D.  ANESTHESIA:  Juliann Pulse, M.D.; Spinal.  ESTIMATED BLOOD LOSS:  400 mL.  FINDINGS:  Viable female infant.  Apgars, arterial cord pH, birth weight pending.  SPECIMENS:  Placenta to Pathology.  INDICATIONS AND CONSENT:  The patient is a 33 year old African American female, G3, P2, at 39-0/7th weeks.  She has had a previous cesarean section.  She desires repeat.  Potential risks and complications were discussed preoperatively, including but not limited to infection, organ damage, bleeding requiring transfusion of blood products with HIV and hepatitis acquisition, DVT, PE, pneumonia, return to the operating room, pelvic pain.  All questions were answered and consent is signed on the chart.  PROCEDURE IN DETAIL:  The patient was taken to the operating room, where spinal anesthetic was placed per Dr. Glennon Mac.  She was placed in a dorsal supine position with 15 degree left lateral wedge.  She was prepped.  Foley catheter was placed and the bladder was drained.  She was draped in a sterile fashion per [facility] protocol.  Time-out was undertaken.  After testing for adequate spinal anesthesia, skin was entered through the previous low transverse scar and dissection was carried out in layers to the peritoneum.  Peritoneum was incised and extended superiorly and inferiorly.  Vesicouterine peritoneum was taken down cephalad laterally.  The bladder  flap was developed and a bladder blade was placed.  Uterus was incised in a low transverse manner.  The uterine cavity was entered bluntly with a hemostat.  The uterine incision was extended cephalad laterally with the fingers.  Artificial rupture of membranes for clear fluid was carried out.  Baby was then delivered without difficulty from the vertex position.  A loose nuchal cord around the shoulder x1 was noted.  Good cry and tone was noted. Cord was clamped and cut and the baby was handed to awaiting Pediatrics team.  Placenta was manually delivered and sent to Pathology.  Uterine cavity was clean.  Uterus was closed in 2 running locking imbricating layers of 0 Monocryl suture.  The bladder flap was also reapproximated and achieved hemostasis with a 2-0 chromic suture.  Anterior peritoneum was closed in a running fashion with 0 Monocryl suture which was also used to reapproximate the pyramidalis muscle in midline.  Anterior rectus fascia was closed in a running fashion with a 0 looped PDS suture. Subcutaneous tissue was reapproximated with interrupted plain suture, and the skin was closed with a subcuticular Vicryl on a Keith needle. Steri-Strips were applied.  Dressings were applied.  All counts were correct.  The patient was taken to the recovery room in stable condition.     Daleen Bo Gaetano Net, M.D.  JET/MEDQ  D:  03/19/2014  T:  03/19/2014  Job:  [id number]

## 2014-03-19 NOTE — Anesthesia Procedure Notes (Signed)
Spinal Patient location during procedure: OR Preanesthetic Checklist Completed: patient identified, site marked, surgical consent, pre-op evaluation, timeout performed, IV checked, risks and benefits discussed and monitors and equipment checked Spinal Block Patient position: sitting Prep: DuraPrep Patient monitoring: heart rate, cardiac monitor, continuous pulse ox and blood pressure Approach: midline Location: L3-4 Injection technique: single-shot Needle Needle type: Sprotte  Needle gauge: 24 G Needle length: 9 cm Assessment Sensory level: T4 Additional Notes Spinal Dosage in OR  Bupivicaine ml       1.6 PFMS04   mcg        100 Fentanyl mcg            25

## 2014-03-19 NOTE — Brief Op Note (Signed)
03/19/2014  8:37 AM  PATIENT:  Mikayla Humphrey  33 y.o. female  PRE-OPERATIVE DIAGNOSIS:  previous X 1  POST-OPERATIVE DIAGNOSIS:  previous X 1  PROCEDURE:  Procedure(s) with comments: CESAREAN SECTION (N/A) - repeat  edc 03/26/14  SURGEON:  Surgeon(s) and Role:    * Allena Katz, MD - Primary  PHYSICIAN ASSISTANT:   ASSISTANTS: none   ANESTHESIA:   spinal  EBL:  Total I/O In: 200 [I.V.:200] Out: 680 [Urine:80; Blood:600]  BLOOD ADMINISTERED:none  DRAINS: Urinary Catheter (Foley)   LOCAL MEDICATIONS USED:  OTHER Exparel 60ml in 20 ml of saline  SPECIMEN:  Source of Specimen:  placenta  DISPOSITION OF SPECIMEN:  PATHOLOGY  COUNTS:  YES  TOURNIQUET:  * No tourniquets in log *  DICTATION: .Other Dictation: Dictation Number [id number]  PLAN OF CARE: Admit to inpatient   PATIENT DISPOSITION:  PACU - hemodynamically stable.   Delay start of Pharmacological VTE agent (>24hrs) due to surgical blood loss or risk of bleeding: not applicable

## 2014-03-19 NOTE — Anesthesia Postprocedure Evaluation (Signed)
  Anesthesia Post-op Note  Patient: Mikayla Humphrey  Procedure(s) Performed: Procedure(s) with comments: CESAREAN SECTION (N/A) - repeat  edc 03/26/14  Patient is awake, responsive, moving her legs, and has signs of resolution of her numbness. Pain and nausea are reasonably well controlled. Vital signs are stable and clinically acceptable. Oxygen saturation is clinically acceptable. There are no apparent anesthetic complications at this time. Patient is ready for discharge.

## 2014-03-19 NOTE — Lactation Note (Signed)
This note was copied from the chart of [place]. Lactation Consultation Note     Initial consult with this mom and full term baby, now 17 1/2 hours old. I assisted mom with getting this sleepy baby to latch. Dad was holding baby, wrapped in many blankets. i explained the benefits of mom holding the baby skin to skin. I positioned mamom and baby for cross cradle hold, and the baby latched with breast compression, deeply, with strong, intermittent suckles.  On exam, I was not able to express any colostrum, breast are floppy, wide set. Mom reports her breast getting full for a brief time, but then got smaller. I am concerned that mom may have hypoplastic breasts. Teaching done on lactation services and breast feeding from thebaby and me booklet. Mom knows to call for questions/concerns.  Patient Name: Boy Lesleyann Fichter [id number] Date: 03/19/2014 Reason for consult: Initial assessment   Maternal Data Formula Feeding for Exclusion: No Has patient been taught Hand Expression?: Yes Does the patient have breastfeeding experience prior to this delivery?: No  Feeding Feeding Type: Breast Fed Length of feed: 13 min (on and off sucking)  LATCH Score/Interventions Latch: Repeated attempts needed to sustain latch, nipple held in mouth throughout feeding, stimulation needed to elicit sucking reflex. Intervention(s): Adjust position;Assist with latch;Breast compression  Audible Swallowing: None  Type of Nipple: Flat (cone shaped , soft, compressible)  Comfort (Breast/Nipple): Soft / non-tender (wide spaced breast, no breast changes during pregnancy, no colostrum hand expresed)     Hold (Positioning): Assistance needed to correctly position infant at breast and maintain latch. Intervention(s): Breastfeeding basics reviewed;Support Pillows;Position options;Skin to skin  LATCH Score: 5  Lactation Tools Discussed/Used     Consult Status Consult Status: Follow-up Date:  03/20/14 Follow-up type: In-patient    Tonna Corner 03/19/2014, 4:27 PM

## 2014-03-19 NOTE — Transfer of Care (Signed)
Immediate Anesthesia Transfer of Care Note  Patient: Mikayla Humphrey  Procedure(s) Performed: Procedure(s) with comments: CESAREAN SECTION (N/A) - repeat  edc 03/26/14  Patient Location: PACU  Anesthesia Type:Spinal  Level of Consciousness: awake, alert  and oriented  Airway & Oxygen Therapy: Patient Spontanous Breathing  Post-op Assessment: Report given to PACU RN and Post -op Vital signs reviewed and stable  Post vital signs: Reviewed and stable  Complications: No apparent anesthesia complications

## 2014-03-19 NOTE — Anesthesia Preprocedure Evaluation (Signed)
Anesthesia Evaluation  Patient identified by MRN, date of birth, ID band Patient awake    Reviewed: Allergy & Precautions, H&P , Patient's Chart, lab work & pertinent test results  Airway Mallampati: II  TM Distance: >3 FB Neck ROM: full    Dental no notable dental hx.    Pulmonary  breath sounds clear to auscultation  Pulmonary exam normal       Cardiovascular Exercise Tolerance: Good Rhythm:regular Rate:Normal     Neuro/Psych    GI/Hepatic   Endo/Other  Morbid obesity  Renal/GU      Musculoskeletal   Abdominal   Peds  Hematology   Anesthesia Other Findings   Reproductive/Obstetrics                             Anesthesia Physical Anesthesia Plan  ASA: III  Anesthesia Plan: Spinal   Post-op Pain Management:    Induction:   Airway Management Planned:   Additional Equipment:   Intra-op Plan:   Post-operative Plan:   Informed Consent: I have reviewed the patients History and Physical, chart, labs and discussed the procedure including the risks, benefits and alternatives for the proposed anesthesia with the patient or authorized representative who has indicated his/her understanding and acceptance.   Dental Advisory Given  Plan Discussed with: CRNA  Anesthesia Plan Comments: (Lab work confirmed with CRNA in room. Platelets okay. Discussed spinal anesthetic, and patient consents to the procedure:  included risk of possible headache,backache, failed block, allergic reaction, and nerve injury. This patient was asked if she had any questions or concerns before the procedure started. )        Anesthesia Quick Evaluation

## 2014-03-19 NOTE — Progress Notes (Signed)
No change to H&P per patient history Reviewed with patient procedure-repeat cesarean section All questions answered

## 2014-03-20 LAB — CBC
HEMATOCRIT: 30.6 % — AB (ref 36.0–46.0)
Hemoglobin: 9.7 g/dL — ABNORMAL LOW (ref 12.0–15.0)
MCH: 27.1 pg (ref 26.0–34.0)
MCHC: 31.7 g/dL (ref 30.0–36.0)
MCV: 85.5 fL (ref 78.0–100.0)
Platelets: 128 10*3/uL — ABNORMAL LOW (ref 150–400)
RBC: 3.58 MIL/uL — ABNORMAL LOW (ref 3.87–5.11)
RDW: 17.5 % — ABNORMAL HIGH (ref 11.5–15.5)
WBC: 9.5 10*3/uL (ref 4.0–10.5)

## 2014-03-20 LAB — CCBB MATERNAL DONOR DRAW

## 2014-03-20 NOTE — Lactation Note (Signed)
This note was copied from the chart of [facility]. Lactation Consultation Note  Patient Name: Mikayla Humphrey [id number] Date: 03/20/2014 Reason for consult: Follow-up assessment   Follow up visit with mom at 36 hrs infant's age; GA [redacted] weeks.[name] has breastfed x4 (10-17 min) + snacks x6 (2-9 min); voids-3 in 24 hrs; stools-2 in 24 hrs; 2% weight loss.  LS-7 by RN.  Dad reports infant is cluster feeding this evening.  Reviewed cluster feeding and feeding cues.  Encouraged to feed with cues at least 8-12 times per day.  LC reviewed Hand expression with return demonstration and observation of small drops of colostrum on tip of nipple.  Mom states this is her first experience with breastfeeding even though she is a P2.  Anticipated discharge for Sunday, Dec. 13.  Mom has a DEBP at home she obtained from [facility] for this pregnancy.  Informed parents of support group and outpatient support after discharge.  Encouraged to call for questions or assistance as needed.    Maternal Data Has patient been taught Hand Expression?: Yes (reviewed HE)    Consult Status Consult Status: Follow-up Date: 03/21/14 Follow-up type: In-patient    Merlene Laughter 03/20/2014, 9:40 PM

## 2014-03-20 NOTE — Addendum Note (Signed)
Addendum  created 03/20/14 1339 by Elenore Paddy, CRNA   Modules edited: Notes Section   Notes Section:  File: [id number]

## 2014-03-20 NOTE — Anesthesia Postprocedure Evaluation (Signed)
  Anesthesia Post-op Note  Patient: Mikayla Humphrey  Procedure(s) Performed: Procedure(s) with comments: CESAREAN SECTION (N/A) - repeat  edc 03/26/14  Patient Location: [facility]  Anesthesia Type:Spinal  Level of Consciousness: awake, alert  and oriented  Airway and Oxygen Therapy: Patient Spontanous Breathing  Post-op Pain: mild  Post-op Assessment: Patient's Cardiovascular Status Stable, Respiratory Function Stable, No signs of Nausea or vomiting, Adequate PO intake and Pain level controlled  Post-op Vital Signs: Reviewed and stable  Last Vitals:  Filed Vitals:   03/20/14 1309  BP: 115/61  Pulse: 98  Temp: 36.6 C  Resp: 19    Complications: No apparent anesthesia complications

## 2014-03-20 NOTE — Progress Notes (Signed)
Subjective: Postpartum Day 1: Cesarean Delivery Patient reports tolerating PO, + flatus and no problems voiding.    Objective: Vital signs in last 24 hours: Temp:  [97.1 F (36.2 C)-98.9 F (37.2 C)] 98.4 F (36.9 C) (12/12 0524) Pulse Rate:  [90-106] 96 (12/12 0524) Resp:  [15-20] 18 (12/12 0524) BP: (103-143)/(50-76) 103/61 mmHg (12/12 0524) SpO2:  [94 %-98 %] 96 % (12/12 0524)  Physical Exam:  General: alert, cooperative and appears stated age 33: appropriate Uterine Fundus: firm Incision: healing well, no significant drainage, no dehiscence, no significant erythema DVT Evaluation: No evidence of DVT seen on physical exam.   Recent Labs  03/18/14 1145 03/20/14 0535  HGB 11.0* 9.7*  HCT 34.8* 30.6*    Assessment/Plan: Status post Cesarean section. Doing well postoperatively.  Continue current care.  Jeromiah Ohalloran L 03/20/2014, 8:46 AM

## 2014-03-21 DIAGNOSIS — Z98891 History of uterine scar from previous surgery: Secondary | ICD-10-CM

## 2014-03-21 LAB — TYPE AND SCREEN
ABO/RH(D): A POS
Antibody Screen: NEGATIVE
UNIT DIVISION: 0
Unit division: 0

## 2014-03-21 MED ORDER — IBUPROFEN 600 MG PO TABS: 600.0000 mg | ORAL_TABLET | Freq: Four times a day (QID) | ORAL | Status: DC

## 2014-03-21 MED ORDER — OXYCODONE-ACETAMINOPHEN 5-325 MG PO TABS: 1.0000 | ORAL_TABLET | ORAL | Status: DC | PRN

## 2014-03-21 NOTE — Discharge Summary (Signed)
Obstetric Discharge Summary Reason for Admission: cesarean section Prenatal Procedures: none Intrapartum Procedures: cesarean: low cervical, transverse Postpartum Procedures: none Complications-Operative and Postpartum: none HEMOGLOBIN  Date Value Ref Range Status  03/20/2014 9.7* 12.0 - 15.0 g/dL Final   HCT  Date Value Ref Range Status  03/20/2014 30.6* 36.0 - 46.0 % Final    Physical Exam:  General: alert, cooperative and appears stated age 33: appropriate Uterine Fundus: firm Incision: healing well, no significant drainage, no dehiscence, no significant erythema DVT Evaluation: No evidence of DVT seen on physical exam.  Discharge Diagnoses: Term Pregnancy-delivered  Discharge Information: Date: 03/21/2014 Activity: pelvic rest Diet: routine Medications: Ibuprofen and Percocet Condition: stable Instructions: refer to practice specific booklet Discharge to: home   Newborn Data: Live born female  Birth Weight: 8 lb 8.5 oz (3870 g) APGAR: 8, 9  Home with mother.  Quetzal Meany L 03/21/2014, 7:58 AM

## 2014-03-21 NOTE — Plan of Care (Signed)
Problem: Phase I Progression Outcomes Goal: Other Phase I Outcomes/Goals Outcome: Completed/Met Date Met:  03/21/14 Ambulating w/o difficulty

## 2014-03-21 NOTE — Plan of Care (Signed)
Problem: Phase II Progression Outcomes Goal: Afebrile, VS remain stable Outcome: Completed/Met Date Met:  03/21/14 VSS at this time. Goal: Other Phase II Outcomes/Goals Outcome: Completed/Met Date Met:  03/21/14 Passing flatus but no BM at this time.  Problem: Discharge Progression Outcomes Goal: Barriers To Progression Addressed/Resolved Outcome: Completed/Met Date Met:  03/21/14 Voids well since Foley removed. Goal: Activity appropriate for discharge plan Outcome: Completed/Met Date Met:  03/21/14 Walks frequently and tolerates well. Goal: Tolerating diet Outcome: Completed/Met Date Met:  03/21/14 Tolerating a Regular diet well. Goal: Pain controlled with appropriate interventions Outcome: Completed/Met Date Met:  03/21/14 Good pain control on po Motrin and Percocet. Goal: Discharge plan in place and appropriate Outcome: Completed/Met Date Met:  03/21/14 VSS Pain controlled Vaginal bleeding WNL Understands incision care Understand self and infant care Knows f/u and when to call MD

## 2014-03-22 ENCOUNTER — Encounter (HOSPITAL_COMMUNITY): Payer: Self-pay | Admitting: Obstetrics and Gynecology

## 2014-05-03 ENCOUNTER — Other Ambulatory Visit: Payer: Self-pay | Admitting: Obstetrics and Gynecology

## 2014-05-04 LAB — CYTOLOGY - PAP

## 2015-03-21 LAB — OB RESULTS CONSOLE GC/CHLAMYDIA
CHLAMYDIA, DNA PROBE: NEGATIVE
Gonorrhea: NEGATIVE

## 2015-03-21 LAB — OB RESULTS CONSOLE ABO/RH: RH TYPE: POSITIVE

## 2015-03-21 LAB — OB RESULTS CONSOLE RPR: RPR: NONREACTIVE

## 2015-03-21 LAB — OB RESULTS CONSOLE HIV ANTIBODY (ROUTINE TESTING): HIV: NONREACTIVE

## 2015-03-21 LAB — OB RESULTS CONSOLE ANTIBODY SCREEN: Antibody Screen: NEGATIVE

## 2015-03-21 LAB — OB RESULTS CONSOLE HEPATITIS B SURFACE ANTIGEN: Hepatitis B Surface Ag: NEGATIVE

## 2015-06-08 ENCOUNTER — Encounter (HOSPITAL_COMMUNITY): Payer: Self-pay | Admitting: Emergency Medicine

## 2015-06-08 ENCOUNTER — Other Ambulatory Visit (HOSPITAL_COMMUNITY)
Admission: RE | Admit: 2015-06-08 | Discharge: 2015-06-08 | Disposition: A | Discharge status: Home / Self Care | Payer: BLUE CROSS/BLUE SHIELD | Source: Ambulatory Visit | Attending: Family Medicine | Admitting: Family Medicine

## 2015-06-08 ENCOUNTER — Emergency Department (HOSPITAL_COMMUNITY)
Admission: EM | Admit: 2015-06-08 | Discharge: 2015-06-08 | Disposition: A | Discharge status: Home / Self Care | Payer: BLUE CROSS/BLUE SHIELD | Source: Home / Self Care | Attending: Family Medicine | Admitting: Family Medicine

## 2015-06-08 DIAGNOSIS — J029 Acute pharyngitis, unspecified: Secondary | ICD-10-CM | POA: Diagnosis not present

## 2015-06-08 LAB — POCT RAPID STREP A: Streptococcus, Group A Screen (Direct): NEGATIVE

## 2015-06-08 NOTE — ED Notes (Addendum)
Patient reports onset of pain started on 2/26.  Patient has left side of throat pain with swallowing foods, not nearly as sensitive when swallowing liquids. Patient reports sensitivity to being touched on the outside of neck in this area as well.  Patient is concerned about blood pressure readings today.  Sees ob/gyn 3/20.  Patient is [redacted] weeks pregnant

## 2015-06-08 NOTE — Discharge Instructions (Signed)
Also have your BP rechecked by your doctor soon.  Pharyngitis Pharyngitis is a sore throat (pharynx). There is redness, pain, and swelling of your throat. HOME CARE   Drink enough fluids to keep your pee (urine) clear or pale yellow.  Only take medicine as told by your doctor.  You may get sick again if you do not take medicine as told. Finish your medicines, even if you start to feel better.  Do not take aspirin.  Rest.  Rinse your mouth (gargle) with salt water ( tsp of salt per 1 qt of water) every 1-2 hours. This will help the pain.  If you are not at risk for choking, you can suck on hard candy or sore throat lozenges. GET HELP IF:  You have large, tender lumps on your neck.  You have a rash.  You cough up green, yellow-brown, or bloody spit. GET HELP RIGHT AWAY IF:   You have a stiff neck.  You drool or cannot swallow liquids.  You throw up (vomit) or are not able to keep medicine or liquids down.  You have very bad pain that does not go away with medicine.  You have problems breathing (not from a stuffy nose). MAKE SURE YOU:   Understand these instructions.  Will watch your condition.  Will get help right away if you are not doing well or get worse.   This information is not intended to replace advice given to you by your health care provider. Make sure you discuss any questions you have with your health care provider.   Document Released: 09/12/2007 Document Revised: 01/14/2013 Document Reviewed: 12/01/2012 Elsevier Interactive Patient Education [employer].

## 2015-06-08 NOTE — ED Provider Notes (Signed)
CSN: [id number]     Arrival date & time 06/08/15  1722 History   First MD Initiated Contact with Patient 06/08/15 1845     Chief Complaint  Patient presents with  . Sore Throat   (Consider location/radiation/quality/duration/timing/severity/associated sxs/prior Treatment) HPI History obtained from patient:   LOCATION:throat SEVERITY:6 DURATION:2 day CONTEXT:sudden onset QUALITY:scratchy MODIFYING FACTORS:OTC meds without relief ASSOCIATED SYMPTOMS:hurts to swallow, hurts to touch left side of thyroid TIMING:now constant  Past Medical History  Diagnosis Date  . Chronic pelvic pain in female 02/16/10  . 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 H/O hemorrhoids   . Abnormal Pap smear 2002  . Hx of bacterial infection   . Yeast infection   . BV (bacterial vaginosis) 05/07/06  . 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 Irregular periods/menstrual cycles 2008  . H/O fatigue 2008  . Candidal vaginitis 2009  . H/O dysmenorrhea 11/10/09  . Endometriosis 06/08/2010  . H/O amenorrhea 07/16/11  . H/O heartburn 07/16/2011  . Hyperlipidemia     diet controlled, no meds  . H/O infertility     IUI pregnancy  . Anemia   . Heartburn in pregnancy    Past Surgical History  Procedure Laterality Date  . Cesarean section  2001    [facility]  . Scare tissue      from c/s incision  . Diagnostic laparoscopy  2011    endometriosis  . Wisdom tooth extraction    . Cesarean section N/A 03/19/2014    Procedure: CESAREAN SECTION;  Surgeon: Allena Katz, MD;  Location: [facility];  Service: Obstetrics;  Laterality: N/A;  repeat  edc 03/26/14   No family history on file. Social History  Substance Use Topics  . Smoking status: Never Smoker   . Smokeless tobacco: Never Used  . Alcohol Use: No   OB History    Gravida Para Term Preterm AB TAB SAB Ectopic Multiple Living   4 3 2 1  0  0  0 2     Review of Systems Sore throat Allergies  Amoxicillin  Home Medications   Prior to  Admission medications   Medication Sig Start Date End Date Taking? Authorizing Provider  ibuprofen (ADVIL,MOTRIN) 600 MG tablet Take 1 tablet (600 mg total) by mouth every 6 (six) hours. Patient not taking: Reported on 06/08/2015 03/21/14   Dian Queen, MD  oxyCODONE-acetaminophen (PERCOCET/ROXICET) 5-325 MG per tablet Take 1 tablet by mouth every 4 (four) hours as needed (for pain scale less than 7). Patient not taking: Reported on 06/08/2015 03/21/14   Dian Queen, MD  Prenatal Vit-Fe Fumarate-FA (PRENATAL MULTIVITAMIN) TABS tablet Take 1 tablet by mouth daily.     Historical Provider, MD  valACYclovir (VALTREX) 500 MG tablet Take 500 mg by mouth daily.    Historical Provider, MD   Meds Ordered and Administered this Visit  Medications - No data to display  BP 148/49 mmHg  Pulse 96  Temp(Src) 98.7 F (37.1 C) (Oral)  Resp 18  SpO2 98% No data found.   Physical Exam NURSES NOTES AND VITAL SIGNS REVIEWED. CONSTITUTIONAL: Well developed, well nourished, no acute distress HEENT: normocephalic, atraumatic, right and left TM's are normal, Throat: without exudate, tender to palpation left thyroid EYES: Conjunctiva normal NECK:normal ROM, supple, no adenopathy PULMONARY:No respiratory distress, normal effort, Lungs: CTAb/l, no wheezes, or increased work of breathing CARDIOVASCULAR: RRR, no murmur ABDOMEN: soft, ND, NT, +'ve BS  MUSCULOSKELETAL: Normal ROM of all extremities,  SKIN: warm and dry  without rash PSYCHIATRIC: Mood and affect, behavior are normal  ED Course  Procedures (including critical care time)  Labs Review Labs Reviewed  POCT RAPID STREP A    Imaging Review No results found.   Visual Acuity Review  Right Eye Distance:   Left Eye Distance:   Bilateral Distance:    Right Eye Near:   Left Eye Near:    Bilateral Near:         MDM   1. Sore throat     Patient is reassured that there is no indication for more advance testing at this time.   Patient is advised to continue home symptomatic treatment.  Patient is advised that if there are new or worsening symptoms or attend the emergency department, or contact primary care provider. Instructions of care provided discharged home in stable condition. Return to work/school note provided.  THIS NOTE WAS GENERATED USING A VOICE RECOGNITION SOFTWARE PROGRAM. ALL REASONABLE EFFORTS  WERE MADE TO PROOFREAD THIS DOCUMENT FOR ACCURACY.    Konrad Felix, [place] 06/08/15 2019

## 2015-06-11 LAB — CULTURE, GROUP A STREP (THRC)

## 2015-08-28 ENCOUNTER — Inpatient Hospital Stay (HOSPITAL_COMMUNITY)
Admission: AD | Admit: 2015-08-28 | Discharge: 2015-08-28 | Disposition: A | Discharge status: Home / Self Care | Payer: BLUE CROSS/BLUE SHIELD | Source: Ambulatory Visit | Attending: Obstetrics and Gynecology | Admitting: Obstetrics and Gynecology

## 2015-08-28 ENCOUNTER — Encounter (HOSPITAL_COMMUNITY): Payer: Self-pay | Admitting: *Deleted

## 2015-08-28 DIAGNOSIS — Z3A31 31 weeks gestation of pregnancy: Secondary | ICD-10-CM | POA: Diagnosis not present

## 2015-08-28 DIAGNOSIS — O9A213 Injury, poisoning and certain other consequences of external causes complicating pregnancy, third trimester: Secondary | ICD-10-CM | POA: Diagnosis not present

## 2015-08-28 DIAGNOSIS — Y92009 Unspecified place in unspecified non-institutional (private) residence as the place of occurrence of the external cause: Secondary | ICD-10-CM

## 2015-08-28 DIAGNOSIS — W1839XA Other fall on same level, initial encounter: Secondary | ICD-10-CM

## 2015-08-28 DIAGNOSIS — S3991XA Unspecified injury of abdomen, initial encounter: Secondary | ICD-10-CM | POA: Diagnosis not present

## 2015-08-28 DIAGNOSIS — O26893 Other specified pregnancy related conditions, third trimester: Secondary | ICD-10-CM | POA: Insufficient documentation

## 2015-08-28 DIAGNOSIS — E785 Hyperlipidemia, unspecified: Secondary | ICD-10-CM | POA: Diagnosis not present

## 2015-08-28 DIAGNOSIS — W19XXXA Unspecified fall, initial encounter: Secondary | ICD-10-CM | POA: Diagnosis not present

## 2015-08-28 DIAGNOSIS — Y92099 Unspecified place in other non-institutional residence as the place of occurrence of the external cause: Secondary | ICD-10-CM

## 2015-08-28 NOTE — MAU Provider Note (Signed)
History     CSN: [id number]  Arrival date and time: 08/28/15 [id number]   First Provider Initiated Contact with Patient 08/28/15 0226      Chief Complaint  Patient presents with  . Fall   HPI [name] is a 35 y.o. [phone] at [redacted]w[redacted]d who presents to MAU today with complaint of a fall. The patient states that she was trying to pick up her trash cans after the rain tonight and fell onto her abdomen around 0100. She denies abdominal pain, vaginal bleeding, LOF or contractions. She has noted occasional mild discomfort described as cramping in the lower abdomen. She denies complications with the pregnancy. She reports good fetal movement.   OB History    Gravida Para Term Preterm AB TAB SAB Ectopic Multiple Living   4 3 2 1  0  0  0 2      Past Medical History  Diagnosis Date  . Chronic pelvic pain in female 02/16/10  . 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 H/O hemorrhoids   . Abnormal Pap smear 2002  . Hx of bacterial infection   . Yeast infection   . BV (bacterial vaginosis) 05/07/06  . 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 Irregular periods/menstrual cycles 2008  . H/O fatigue 2008  . Candidal vaginitis 2009  . H/O dysmenorrhea 11/10/09  . Endometriosis 06/08/2010  . H/O amenorrhea 07/16/11  . H/O heartburn 07/16/2011  . Hyperlipidemia     diet controlled, no meds  . H/O infertility     IUI pregnancy  . Anemia   . Heartburn in pregnancy     Past Surgical History  Procedure Laterality Date  . Cesarean section  2001    [facility]  . Scare tissue      from c/s incision  . Diagnostic laparoscopy  2011    endometriosis  . Wisdom tooth extraction    . Cesarean section N/A 03/19/2014    Procedure: CESAREAN SECTION;  Surgeon: Allena Katz, MD;  Location: [facility];  Service: Obstetrics;  Laterality: N/A;  repeat  edc 03/26/14    No family history on file.  Social History  Substance Use Topics  . Smoking status: Never Smoker   . Smokeless tobacco: Never  Used  . Alcohol Use: No    Allergies:  Allergies  Allergen Reactions  . Amoxicillin Hives and Rash    Prescriptions prior to admission  Medication Sig Dispense Refill Last Dose  . ferrous sulfate 325 (65 FE) MG tablet Take 325 mg by mouth daily with breakfast.   08/27/2015 at Unknown time  . Prenatal Vit-Fe Fumarate-FA (PRENATAL MULTIVITAMIN) TABS tablet Take 1 tablet by mouth daily.    08/28/2015 at Unknown time  . valACYclovir (VALTREX) 500 MG tablet Take 500 mg by mouth daily.   Past Month at Unknown time  . ibuprofen (ADVIL,MOTRIN) 600 MG tablet Take 1 tablet (600 mg total) by mouth every 6 (six) hours. (Patient not taking: Reported on 06/08/2015) 60 tablet 0   . oxyCODONE-acetaminophen (PERCOCET/ROXICET) 5-325 MG per tablet Take 1 tablet by mouth every 4 (four) hours as needed (for pain scale less than 7). (Patient not taking: Reported on 06/08/2015) 30 tablet 0     Review of Systems  Constitutional: Negative for fever and malaise/fatigue.  Gastrointestinal: Negative for abdominal pain.  Genitourinary:       Neg -vaginal bleeding, LOF   Physical Exam   Blood pressure 128/64, pulse 90, temperature 97.8 F (36.6 C),  resp. rate 18, height 5\' 7"  (1.702 m), weight 277 lb 3.2 oz (125.737 kg), unknown if currently breastfeeding.  Physical Exam  Nursing note and vitals reviewed. Constitutional: She is oriented to person, place, and time. She appears well-developed and well-nourished. No distress.  HENT:  Head: Normocephalic and atraumatic.  Cardiovascular: Normal rate.   Respiratory: Effort normal.  GI: Soft. She exhibits no distension and no mass. There is no tenderness. There is no rebound and no guarding.  Neurological: She is alert and oriented to person, place, and time.  Skin: Skin is warm and dry. No erythema.  Psychiatric: She has a normal mood and affect.  Effacement (%): Thick Cervical Position: Posterior Exam by:: Tomi Bamberger, PA-C/D. Gabriel Earing, RN  Fetal  Monitoring: Baseline: 120 bpm Variability: moderate Accelerations: 15 x 15 Decelerations: none Contractions: q 4-7 minutes initially and then moderate UI with occasional contractions  MAU Course  Procedures None  MDM Discussed patient with Dr. Julien Girt. Requests 4 hours of EFM with TOCO due to abdominal trauma. Since she denies pain, bleeding or LOF, [place] is not needed at this time.  A+ blood type in Epic from previous visit.  Discussed patient with Dr. Julien Girt prior to discharge. Recommends cervix check, if closed, may be discharge. Will need to call for follow-up appointment on Monday.  Cervix is so posterior that I cannot determine for certain that it is closed, I can feel the anterior lip which is thick and firm, unlikely to have significant dilation at this time.  Assessment and Plan  A: [facility] at [redacted]w[redacted]d Fall at home Abdominal trauma in pregnancy  P: Discharge home Preterm labor precautions discussed Patient advised to follow-up with [facility] on Monday, patient advised to call for an appointment.  Patient may return to MAU as needed or if her condition were to change or worsen   Luvenia Redden, PA-C  08/28/2015, 6:24 AM

## 2015-08-28 NOTE — MAU Note (Signed)
PT SAYS AT 0100- SHE WAS OUTSIDE  PICKING UP FALLEN  TRASHCAN  - SLIPPED AND FELL   FACE FORWARD - DIRECTLY ON ABD .    HAS FELT UC  ,  NO FLUID.    NOW  FEELS  CRAMPING    IN BACK.

## 2015-08-28 NOTE — Discharge Instructions (Signed)
What Do I Need to Know About Injuries During Pregnancy? Injuries can happen during pregnancy. Minor falls and accidents usually do not harm you or your baby. However, any injury should be reported to your doctor. WHAT CAN I DO TO PROTECT MYSELF FROM INJURIES?  Remove rugs and loose objects on the floor.  Wear comfortable shoes that have a good grip. Do not wear high-heeled shoes.  Always wear your seat belt. The lap belt should be below your belly. Always practice safe driving.  Do not ride on a motorcycle.  Do not participate in high-impact activities or sports.  Avoid:  Walking on wet or slippery floors.  Fires.  Starting fires.  Lifting heavy pots of boiling or hot liquids.  Fixing electrical problems.  Only take medicine as told by your doctor.  Know your blood type and the blood type of the baby's father.  Call your local emergency services (911 in the [place] if you are a victim of domestic violence or assault. For help and support, contact the [employer]. WHEN SHOULD I GET HELP RIGHT AWAY?  You fall on your belly or have any high-impact accident or injury.  You have been a victim of domestic violence or any kind of violence.  You have been in a car accident.  You have bleeding from your vagina.  Fluid is leaking from your vagina.  You start to have belly cramping (contractions) or pain.  You feel weak or pass out (faint).  You start to throw up (vomit) after an injury.  You have been burned.  You have a stiff neck or neck pain.  You get a headache or have vision problems after an injury.  You do not feel the baby move or the baby is not moving as much as normal.   This information is not intended to replace advice given to you by your health care provider. Make sure you discuss any questions you have with your health care provider.   Document Released: 04/28/2010 Document Revised: 04/16/2014 Document Reviewed:  12/31/2012 Elsevier Interactive Patient Education [employer].

## 2015-08-28 NOTE — MAU Note (Signed)
About 0045 went to pick up a trash can that had turned over in grass. Slipped and fell on my knees and my tummy hit the ground. Tried to catch myself but slide down. Denies LOF or bleeding. Has felt good FM since.. Having some lower back pain and lower abd cramping

## 2015-09-26 ENCOUNTER — Inpatient Hospital Stay (HOSPITAL_COMMUNITY)
Admission: AD | Admit: 2015-09-26 | Discharge: 2015-09-26 | Disposition: A | Discharge status: Home / Self Care | Payer: BLUE CROSS/BLUE SHIELD | Source: Ambulatory Visit | Attending: Obstetrics & Gynecology | Admitting: Obstetrics & Gynecology

## 2015-09-26 ENCOUNTER — Encounter (HOSPITAL_COMMUNITY): Payer: Self-pay

## 2015-09-26 DIAGNOSIS — Z3A35 35 weeks gestation of pregnancy: Secondary | ICD-10-CM | POA: Insufficient documentation

## 2015-09-26 DIAGNOSIS — O23593 Infection of other part of genital tract in pregnancy, third trimester: Secondary | ICD-10-CM | POA: Diagnosis not present

## 2015-09-26 DIAGNOSIS — Z880 Allergy status to penicillin: Secondary | ICD-10-CM | POA: Diagnosis not present

## 2015-09-26 DIAGNOSIS — O479 False labor, unspecified: Secondary | ICD-10-CM

## 2015-09-26 DIAGNOSIS — O4703 False labor before 37 completed weeks of gestation, third trimester: Secondary | ICD-10-CM

## 2015-09-26 LAB — URINALYSIS, ROUTINE W REFLEX MICROSCOPIC
Bilirubin Urine: NEGATIVE
Glucose, UA: NEGATIVE mg/dL
Hgb urine dipstick: NEGATIVE
KETONES UR: 15 mg/dL — AB
Nitrite: NEGATIVE
PH: 6 (ref 5.0–8.0)
PROTEIN: NEGATIVE mg/dL
Specific Gravity, Urine: 1.015 (ref 1.005–1.030)

## 2015-09-26 LAB — URINE MICROSCOPIC-ADD ON: RBC / HPF: NONE SEEN RBC/hpf (ref 0–5)

## 2015-09-26 LAB — WET PREP, GENITAL
Sperm: NONE SEEN
Trich, Wet Prep: NONE SEEN
Yeast Wet Prep HPF POC: NONE SEEN

## 2015-09-26 MED ORDER — METRONIDAZOLE 500 MG PO TABS: 500.0000 mg | ORAL_TABLET | Freq: Two times a day (BID) | ORAL | Status: DC

## 2015-09-26 NOTE — MAU Provider Note (Signed)
History   [id number]   Chief Complaint  Patient presents with  . Contractions    HPI Mikayla Humphrey is a 35 y.o. female  [id number] at [redacted]w[redacted]d IUP here with report of contractions that [place] then went away.  Contractions returned around 2330 and felt 6-7/hour.  Continues to feel contractions.  Pain is rated a 4/10.  Denies vaginal bleeding or leaking of fluid.  +fetal movement.  Also reports vaginal soreness and edema.  No report of itching.  Denies  headache, vision changes, or epigastric pain. Pt declines procardia, "not hurting that much".       No LMP recorded. Patient is pregnant.  OB History  Gravida Para Term Preterm AB SAB TAB Ectopic Multiple Living  4 3 2 1  0 0   0 2    # Outcome Date GA Lbr Len/2nd Weight Sex Delivery Anes PTL Lv  4 Current           3 Term 03/19/14 [redacted]w[redacted]d  8 lb 8.5 oz (3.87 kg) M CS-LTranv Spinal  Y     Comments: rudimentary extra digit left hand  2 Term 2001 [redacted]w[redacted]d   F CS-LTranv   Y     Complications: Failure to Progress in Second Stage  1 Preterm 1999 [redacted]w[redacted]d   U VBAC   N     Complications: Fetal demise, less than 22 weeks      Past Medical History  Diagnosis Date  . Chronic pelvic pain in female 02/16/10  . 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 H/O hemorrhoids   . Abnormal Pap smear 2002  . Hx of bacterial infection   . Yeast infection   . BV (bacterial vaginosis) 05/07/06  . 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 Irregular periods/menstrual cycles 2008  . H/O fatigue 2008  . Candidal vaginitis 2009  . H/O dysmenorrhea 11/10/09  . Endometriosis 06/08/2010  . H/O amenorrhea 07/16/11  . H/O heartburn 07/16/2011  . Hyperlipidemia     diet controlled, no meds  . H/O infertility     IUI pregnancy  . Anemia   . Heartburn in pregnancy     History reviewed. No pertinent family history.  Social History   Social History  . Marital Status: Married    Spouse Name: N/A  . Number of Children: N/A  . Years  of Education: N/A   Social History Main Topics  . Smoking status: Never Smoker   . Smokeless tobacco: Never Used  . Alcohol Use: No  . Drug Use: No  . Sexual Activity: Yes    Birth Control/ Protection: None     Comment: pregnant   Other Topics Concern  . None   Social History Narrative    Allergies  Allergen Reactions  . Amoxicillin Hives and Rash    No current facility-administered medications on file prior to encounter.   Current Outpatient Prescriptions on File Prior to Encounter  Medication Sig Dispense Refill  . ferrous sulfate 325 (65 FE) MG tablet Take 325 mg by mouth daily with breakfast.    . Prenatal Vit-Fe Fumarate-FA (PRENATAL MULTIVITAMIN) TABS tablet Take 1 tablet by mouth daily.     . valACYclovir (VALTREX) 500 MG tablet Take 500 mg by mouth daily.       Review of Systems  Eyes: Negative for visual disturbance.  Gastrointestinal: Negative for abdominal pain.  Genitourinary: Positive for pelvic pain (contractions). Negative for dysuria, frequency and vaginal bleeding.  Neurological:  Negative for headaches.  All other systems reviewed and are negative.    Physical Exam   Filed Vitals:   09/26/15 0136 09/26/15 0158 09/26/15 0310 09/26/15 0320  BP: 150/75 132/61 123/73 112/76  Pulse: 79 81 80 80  Temp: 97.6 F (36.4 C)     TempSrc: Oral     Resp: 20     Height: 5\' 7"  (1.702 m)     Weight: 284 lb (128.822 kg)     SpO2: 99%        Physical Exam  Constitutional: She is oriented to person, place, and time. She appears well-developed and well-nourished.  HENT:  Head: Normocephalic.  Neck: Normal range of motion. Neck supple.  Cardiovascular: Normal rate, regular rhythm and normal heart sounds.   Respiratory: Effort normal and breath sounds normal. No respiratory distress.  GI: Soft. There is no tenderness.  Genitourinary: No bleeding in the vagina. Vaginal discharge (mucusy) found.  Musculoskeletal: Normal range of motion. She exhibits no edema.   Neurological: She is alert and oriented to person, place, and time. She displays normal reflexes.  Skin: Skin is warm and dry.   Dilation: Closed Effacement (%): Thick Cervical Position: Posterior Exam by:: Reina Fuse, CNM  FHR 120's, +accels Toco 4.5 - 8 min  MAU Course  Procedures  MDM Results for orders placed or performed during the hospital encounter of 09/26/15 (from the past 24 hour(s))  Urinalysis, Routine w reflex microscopic (not at [facility])     Status: Abnormal   Collection Time: 09/26/15  1:36 AM  Result Value Ref Range   Color, Urine YELLOW YELLOW   APPearance CLEAR CLEAR   Specific Gravity, Urine 1.015 1.005 - 1.030   pH 6.0 5.0 - 8.0   Glucose, UA NEGATIVE NEGATIVE mg/dL   Hgb urine dipstick NEGATIVE NEGATIVE   Bilirubin Urine NEGATIVE NEGATIVE   Ketones, ur 15 (A) NEGATIVE mg/dL   Protein, ur NEGATIVE NEGATIVE mg/dL   Nitrite NEGATIVE NEGATIVE   Leukocytes, UA SMALL (A) NEGATIVE  Urine microscopic-add on     Status: Abnormal   Collection Time: 09/26/15  1:36 AM  Result Value Ref Range   Squamous Epithelial / LPF 0-5 (A) NONE SEEN   WBC, UA 0-5 0 - 5 WBC/hpf   RBC / HPF NONE SEEN 0 - 5 RBC/hpf   Bacteria, UA RARE (A) NONE SEEN  Wet prep, genital     Status: Abnormal   Collection Time: 09/26/15  2:38 AM  Result Value Ref Range   Yeast Wet Prep HPF POC NONE SEEN NONE SEEN   Trich, Wet Prep NONE SEEN NONE SEEN   Clue Cells Wet Prep HPF POC PRESENT (A) NONE SEEN   WBC, Wet Prep HPF POC FEW (A) NONE SEEN   Sperm NONE SEEN    0310 Consulted with Dr. Lynnette Caffey > Reviewed HPI/Exam/labs/vitals > repeat blood pressure 2 additional times; if nml, may discharge home  Assessment and Plan  35 y.o. [id number] at [redacted]w[redacted]d IUP  Reactive NST Bacterial Vaginosis Braxton Hick's  Plan: Discharge home RX Flagyl  500 mg BID x 7 days Keep scheduled appt for Tuesday Reviewed signs of labor  Gwen Pounds, CNM 09/26/2015 3:15 AM

## 2015-09-26 NOTE — MAU Note (Signed)
Pt reports contractions for the last 24 hours but worsening tonight. Denies bleeding or ROM.

## 2015-09-26 NOTE — Discharge Instructions (Signed)
Braxton Hicks Contractions °Contractions of the uterus can occur throughout pregnancy. Contractions are not always a sign that you are in labor.  °WHAT ARE BRAXTON HICKS CONTRACTIONS?  °Contractions that occur before labor are called Braxton Hicks contractions, or false labor. Toward the end of pregnancy (32-34 weeks), these contractions can develop more often and may become more forceful. This is not true labor because these contractions do not result in opening (dilatation) and thinning of the cervix. They are sometimes difficult to tell apart from true labor because these contractions can be forceful and people have different pain tolerances. You should not feel embarrassed if you go to the hospital with false labor. Sometimes, the only way to tell if you are in true labor is for your health care provider to look for changes in the cervix. °If there are no prenatal problems or other health problems associated with the pregnancy, it is completely safe to be sent home with false labor and await the onset of true labor. °HOW CAN YOU TELL THE DIFFERENCE BETWEEN TRUE AND FALSE LABOR? °False Labor °· The contractions of false labor are usually shorter and not as hard as those of true labor.   °· The contractions are usually irregular.   °· The contractions are often felt in the front of the lower abdomen and in the groin.   °· The contractions may go away when you walk around or change positions while lying down.   °· The contractions get weaker and are shorter lasting as time goes on.   °· The contractions do not usually become progressively stronger, regular, and closer together as with true labor.   °True Labor °· Contractions in true labor last 30-70 seconds, become very regular, usually become more intense, and increase in frequency.   °· The contractions do not go away with walking.   °· The discomfort is usually felt in the top of the uterus and spreads to the lower abdomen and low back.   °· True labor can be  determined by your health care provider with an exam. This will show that the cervix is dilating and getting thinner.   °WHAT TO REMEMBER °· Keep up with your usual exercises and follow other instructions given by your health care provider.   °· Take medicines as directed by your health care provider.   °· Keep your regular prenatal appointments.   °· Eat and drink lightly if you think you are going into labor.   °· If Braxton Hicks contractions are making you uncomfortable:   °¨ Change your position from lying down or resting to walking, or from walking to resting.   °¨ Sit and rest in a tub of warm water.   °¨ Drink 2-3 glasses of water. Dehydration may cause these contractions.   °¨ Do slow and deep breathing several times an hour.   °WHEN SHOULD I SEEK IMMEDIATE MEDICAL CARE? °Seek immediate medical care if: °· Your contractions become stronger, more regular, and closer together.   °· You have fluid leaking or gushing from your vagina.   °· You have a fever.   °· You pass blood-tinged mucus.   °· You have vaginal bleeding.   °· You have continuous abdominal pain.   °· You have low back pain that you never had before.   °· You feel your baby's head pushing down and causing pelvic pressure.   °· Your baby is not moving as much as it used to.   °  °This information is not intended to replace advice given to you by your health care provider. Make sure you discuss any questions you have with your health care   provider. °  °Document Released: 03/26/2005 Document Revised: 03/31/2013 Document Reviewed: 01/05/2013 °Elsevier Interactive Patient Education ©2016 [employer] ° °

## 2015-09-28 LAB — OB RESULTS CONSOLE GBS: STREP GROUP B AG: NEGATIVE

## 2015-10-13 ENCOUNTER — Encounter (HOSPITAL_COMMUNITY): Payer: Self-pay

## 2015-10-20 ENCOUNTER — Encounter (HOSPITAL_COMMUNITY)
Admission: RE | Admit: 2015-10-20 | Discharge: 2015-10-20 | Disposition: A | Discharge status: Home / Self Care | Payer: BLUE CROSS/BLUE SHIELD | Source: Ambulatory Visit | Attending: Obstetrics and Gynecology | Admitting: Obstetrics and Gynecology

## 2015-10-20 ENCOUNTER — Encounter (HOSPITAL_COMMUNITY): Payer: Self-pay

## 2015-10-20 LAB — CBC
HCT: 34.8 % — ABNORMAL LOW (ref 36.0–46.0)
HEMOGLOBIN: 11 g/dL — AB (ref 12.0–15.0)
MCH: 26.5 pg (ref 26.0–34.0)
MCHC: 31.6 g/dL (ref 30.0–36.0)
MCV: 83.9 fL (ref 78.0–100.0)
Platelets: 160 10*3/uL (ref 150–400)
RBC: 4.15 MIL/uL (ref 3.87–5.11)
RDW: 17.4 % — ABNORMAL HIGH (ref 11.5–15.5)
WBC: 7.6 10*3/uL (ref 4.0–10.5)

## 2015-10-20 LAB — TYPE AND SCREEN
ABO/RH(D): A POS
Antibody Screen: NEGATIVE

## 2015-10-20 MED ORDER — GENTAMICIN SULFATE 40 MG/ML IJ SOLN
INTRAVENOUS | Status: AC
  Administered 2015-10-21: 117 mL via INTRAVENOUS
  Filled 2015-10-20: qty 11

## 2015-10-20 NOTE — H&P (Signed)
Mikayla Humphrey is a 35 y.o. female presenting for repeat cesarean section. Pregnancy complicated by AMA with normal Panorama and Hx of HSV on valtrex. History OB History    Gravida Para Term Preterm AB TAB SAB Ectopic Multiple Living   4 3 2 1  0  0  0 2     Past Medical History  Diagnosis Date  . Chronic pelvic pain in female 02/16/10  . 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 H/O hemorrhoids   . Abnormal Pap smear 2002  . Hx of bacterial infection   . Yeast infection   . BV (bacterial vaginosis) 05/07/06  . 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 Irregular periods/menstrual cycles 2008  . H/O fatigue 2008  . H/O dysmenorrhea 11/10/09  . Endometriosis 06/08/2010  . H/O amenorrhea 07/16/11  . H/O heartburn 07/16/2011  . Hyperlipidemia     diet controlled, no meds  . H/O infertility     IUI pregnancy  . Anemia   . Heartburn in pregnancy    Past Surgical History  Procedure Laterality Date  . Cesarean section  2001    [facility]  . Scare tissue      from c/s incision  . Diagnostic laparoscopy  2011    endometriosis  . Wisdom tooth extraction    . Cesarean section N/A 03/19/2014    Procedure: CESAREAN SECTION;  Surgeon: Allena Katz, MD;  Location: [place];  Service: Obstetrics;  Laterality: N/A;  repeat  edc 03/26/14   Family History: family history includes Bipolar disorder in her sister; Hypertension in her maternal grandmother; Stroke in her paternal grandmother. Social History:  reports that she has never smoked. She has never used smokeless tobacco. She reports that she does not drink alcohol or use illicit drugs.   Prenatal Transfer Tool  Maternal Diabetes: No Genetic Screening: Normal Maternal Ultrasounds/Referrals: Normal Fetal Ultrasounds or other Referrals:  None Maternal Substance Abuse:  No Significant Maternal Medications:  None Significant Maternal Lab Results:  None Other Comments:  None  Review of Systems  Eyes: Negative for blurred vision.   Gastrointestinal: Negative for abdominal pain.  Neurological: Negative for headaches.      unknown if currently breastfeeding. Exam Physical Exam  Cardiovascular: Normal rate.   Respiratory: Effort normal.  GI: Soft.    Prenatal labs: ABO, Rh: --/--/A POS (07/13 1130) Antibody: NEG (07/13 1130) Rubella:   RPR: Nonreactive (12/12 0000)  HBsAg: Negative (12/12 0000)  HIV: Non-reactive (12/12 0000)  GBS: Negative (06/21 0000)   Assessment/Plan: 35 yo G4P2 AT 39 1/7 weeks with two previous cesarean sections for repeat Risks reviewed preoperatively including infection, organ damage, bleeding/transfusion-HIV/Hep, DVT/PE, pneumonia.   Mikayla Humphrey,Mikayla Humphrey E 10/20/2015, 12:49 PM

## 2015-10-20 NOTE — Patient Instructions (Signed)
[place]  10/20/2015   Your procedure is scheduled on:  10/21/2015  Enter through the Main Entrance of [facility] [place] up the phone at the desk and dial [phone].   Call this number if you have problems the morning of surgery: [phone]   Remember:   Do not eat food:After Midnight.  Do not drink clear liquids: After Midnight.  Take these medicines the morning of surgery with A SIP OF WATER: valtrex   Do not wear jewelry, make-up or nail polish.  Do not wear lotions, powders, or perfumes. You may wear deodorant.  Do not shave 48 hours prior to surgery.  Do not bring valuables to the hospital.  [facility] is not   responsible for any belongings or valuables brought to the hospital.  Contacts, dentures or bridgework may not be worn into surgery.  Leave suitcase in the car. After surgery it may be brought to your room.  For patients admitted to the hospital, checkout time is 11:00 AM the day of              discharge.   Patients discharged the day of surgery will not be allowed to drive             home.  Name and phone number of your driver: na   Special Instructions:   N/A   Please read over the following fact sheets that you were given:   Surgical Site Infection Prevention

## 2015-10-21 ENCOUNTER — Encounter (HOSPITAL_COMMUNITY)
Admission: RE | Disposition: A | Discharge status: Home / Self Care | Payer: Self-pay | Source: Ambulatory Visit | Attending: Obstetrics and Gynecology

## 2015-10-21 ENCOUNTER — Inpatient Hospital Stay (HOSPITAL_COMMUNITY): Payer: BLUE CROSS/BLUE SHIELD | Admitting: Anesthesiology

## 2015-10-21 ENCOUNTER — Inpatient Hospital Stay (HOSPITAL_COMMUNITY)
Admission: RE | Admit: 2015-10-21 | Discharge: 2015-10-24 | DRG: 765 | Disposition: A | Discharge status: Home / Self Care | Payer: BLUE CROSS/BLUE SHIELD | Source: Ambulatory Visit | Attending: Obstetrics and Gynecology | Admitting: Obstetrics and Gynecology

## 2015-10-21 ENCOUNTER — Encounter (HOSPITAL_COMMUNITY): Payer: Self-pay

## 2015-10-21 DIAGNOSIS — O99284 Endocrine, nutritional and metabolic diseases complicating childbirth: Secondary | ICD-10-CM | POA: Diagnosis present

## 2015-10-21 DIAGNOSIS — Z3A39 39 weeks gestation of pregnancy: Secondary | ICD-10-CM

## 2015-10-21 DIAGNOSIS — A6000 Herpesviral infection of urogenital system, unspecified: Secondary | ICD-10-CM | POA: Diagnosis present

## 2015-10-21 DIAGNOSIS — Z818 Family history of other mental and behavioral disorders: Secondary | ICD-10-CM

## 2015-10-21 DIAGNOSIS — O9832 Other infections with a predominantly sexual mode of transmission complicating childbirth: Secondary | ICD-10-CM | POA: Diagnosis present

## 2015-10-21 DIAGNOSIS — E785 Hyperlipidemia, unspecified: Secondary | ICD-10-CM | POA: Diagnosis present

## 2015-10-21 DIAGNOSIS — O9962 Diseases of the digestive system complicating childbirth: Secondary | ICD-10-CM | POA: Diagnosis present

## 2015-10-21 DIAGNOSIS — Z8249 Family history of ischemic heart disease and other diseases of the circulatory system: Secondary | ICD-10-CM | POA: Diagnosis not present

## 2015-10-21 DIAGNOSIS — Z6841 Body Mass Index (BMI) 40.0 and over, adult: Secondary | ICD-10-CM

## 2015-10-21 DIAGNOSIS — Z823 Family history of stroke: Secondary | ICD-10-CM | POA: Diagnosis not present

## 2015-10-21 DIAGNOSIS — O34211 Maternal care for low transverse scar from previous cesarean delivery: Secondary | ICD-10-CM | POA: Diagnosis present

## 2015-10-21 DIAGNOSIS — O99214 Obesity complicating childbirth: Secondary | ICD-10-CM | POA: Diagnosis present

## 2015-10-21 DIAGNOSIS — K219 Gastro-esophageal reflux disease without esophagitis: Secondary | ICD-10-CM | POA: Diagnosis present

## 2015-10-21 DIAGNOSIS — Z98891 History of uterine scar from previous surgery: Secondary | ICD-10-CM

## 2015-10-21 HISTORY — DX: Morbid (severe) obesity due to excess calories: E66.01

## 2015-10-21 LAB — RPR: RPR: NONREACTIVE

## 2015-10-21 SURGERY — Surgical Case
Anesthesia: Spinal

## 2015-10-21 MED ORDER — KETOROLAC TROMETHAMINE 30 MG/ML IJ SOLN
INTRAMUSCULAR | Status: AC
  Filled 2015-10-21: qty 1

## 2015-10-21 MED ORDER — LIDOCAINE HCL 1 % IJ SOLN
INTRAMUSCULAR | Status: AC
  Filled 2015-10-21: qty 20

## 2015-10-21 MED ORDER — MORPHINE SULFATE (PF) 0.5 MG/ML IJ SOLN
INTRAMUSCULAR | Status: AC
  Filled 2015-10-21: qty 10

## 2015-10-21 MED ORDER — COCONUT OIL OIL
1.0000 "application " | TOPICAL_OIL | Status: DC | PRN
  Administered 2015-10-23: 1 via TOPICAL
  Filled 2015-10-21: qty 120

## 2015-10-21 MED ORDER — PRENATAL MULTIVITAMIN CH
1.0000 | ORAL_TABLET | Freq: Every day | ORAL | Status: DC
  Administered 2015-10-22 – 2015-10-23 (×2): 1 via ORAL
  Filled 2015-10-21 (×2): qty 1

## 2015-10-21 MED ORDER — PHENYLEPHRINE 8 MG IN D5W 100 ML (0.08MG/ML) PREMIX OPTIME
INJECTION | INTRAVENOUS | Status: DC | PRN
  Administered 2015-10-21: 60 ug/min via INTRAVENOUS

## 2015-10-21 MED ORDER — ONDANSETRON HCL 4 MG/2ML IJ SOLN
INTRAMUSCULAR | Status: DC | PRN
Start: 2015-10-21 — End: 2015-10-21
  Administered 2015-10-21: 4 mg via INTRAVENOUS

## 2015-10-21 MED ORDER — OXYCODONE HCL 5 MG PO TABS: 10.0000 mg | ORAL_TABLET | ORAL | Status: DC | PRN

## 2015-10-21 MED ORDER — NALOXONE HCL 2 MG/2ML IJ SOSY
1.0000 ug/kg/h | PREFILLED_SYRINGE | INTRAMUSCULAR | Status: DC | PRN
  Filled 2015-10-21: qty 2

## 2015-10-21 MED ORDER — ZOLPIDEM TARTRATE 5 MG PO TABS: 5.0000 mg | ORAL_TABLET | Freq: Every evening | ORAL | Status: DC | PRN

## 2015-10-21 MED ORDER — OXYTOCIN 10 UNIT/ML IJ SOLN
INTRAMUSCULAR | Status: AC
Start: 2015-10-21 — End: 2015-10-21
  Filled 2015-10-21: qty 4

## 2015-10-21 MED ORDER — FENTANYL CITRATE (PF) 100 MCG/2ML IJ SOLN
INTRAMUSCULAR | Status: AC
  Filled 2015-10-21: qty 2

## 2015-10-21 MED ORDER — LACTATED RINGERS IV SOLN: INTRAVENOUS | Status: DC

## 2015-10-21 MED ORDER — NALBUPHINE HCL 10 MG/ML IJ SOLN: 5.0000 mg | Freq: Once | INTRAMUSCULAR | Status: DC | PRN

## 2015-10-21 MED ORDER — DIPHENHYDRAMINE HCL 50 MG/ML IJ SOLN: 12.5000 mg | INTRAMUSCULAR | Status: DC | PRN

## 2015-10-21 MED ORDER — SIMETHICONE 80 MG PO CHEW
80.0000 mg | CHEWABLE_TABLET | ORAL | Status: DC
  Administered 2015-10-21 – 2015-10-23 (×3): 80 mg via ORAL
  Filled 2015-10-21 (×3): qty 1

## 2015-10-21 MED ORDER — LACTATED RINGERS IV SOLN
INTRAVENOUS | Status: DC | PRN
  Administered 2015-10-21 (×2): via INTRAVENOUS

## 2015-10-21 MED ORDER — IBUPROFEN 600 MG PO TABS
600.0000 mg | ORAL_TABLET | Freq: Four times a day (QID) | ORAL | Status: DC
  Administered 2015-10-21 – 2015-10-24 (×11): 600 mg via ORAL
  Filled 2015-10-21 (×11): qty 1

## 2015-10-21 MED ORDER — TETANUS-DIPHTH-ACELL PERTUSSIS 5-2.5-18.5 LF-MCG/0.5 IM SUSP
0.5000 mL | Freq: Once | INTRAMUSCULAR | Status: DC
Start: 2015-10-22 — End: 2015-10-24

## 2015-10-21 MED ORDER — MORPHINE SULFATE (PF) 0.5 MG/ML IJ SOLN
INTRAMUSCULAR | Status: DC | PRN
  Administered 2015-10-21: .2 mg via EPIDURAL

## 2015-10-21 MED ORDER — MIDAZOLAM HCL 2 MG/2ML IJ SOLN: 0.5000 mg | Freq: Once | INTRAMUSCULAR | Status: DC | PRN

## 2015-10-21 MED ORDER — LACTATED RINGERS IV SOLN
INTRAVENOUS | Status: DC
  Administered 2015-10-21 (×3): via INTRAVENOUS

## 2015-10-21 MED ORDER — DEXAMETHASONE SODIUM PHOSPHATE 4 MG/ML IJ SOLN
INTRAMUSCULAR | Status: AC
  Filled 2015-10-21: qty 1

## 2015-10-21 MED ORDER — ONDANSETRON HCL 4 MG/2ML IJ SOLN: 4.0000 mg | Freq: Three times a day (TID) | INTRAMUSCULAR | Status: DC | PRN

## 2015-10-21 MED ORDER — BUPIVACAINE IN DEXTROSE 0.75-8.25 % IT SOLN
INTRATHECAL | Status: DC | PRN
  Administered 2015-10-21: 12 mL via INTRATHECAL

## 2015-10-21 MED ORDER — HYDROMORPHONE HCL 1 MG/ML IJ SOLN: 0.2500 mg | INTRAMUSCULAR | Status: DC | PRN

## 2015-10-21 MED ORDER — MEPERIDINE HCL 25 MG/ML IJ SOLN: 6.2500 mg | INTRAMUSCULAR | Status: DC | PRN

## 2015-10-21 MED ORDER — NALOXONE HCL 0.4 MG/ML IJ SOLN: 0.4000 mg | INTRAMUSCULAR | Status: DC | PRN

## 2015-10-21 MED ORDER — OXYTOCIN 40 UNITS IN LACTATED RINGERS INFUSION - SIMPLE MED: 2.5000 [IU]/h | INTRAVENOUS | Status: AC

## 2015-10-21 MED ORDER — SCOPOLAMINE 1 MG/3DAYS TD PT72
1.0000 | MEDICATED_PATCH | Freq: Once | TRANSDERMAL | Status: DC
  Administered 2015-10-21: 1.5 mg via TRANSDERMAL

## 2015-10-21 MED ORDER — SIMETHICONE 80 MG PO CHEW: 80.0000 mg | CHEWABLE_TABLET | ORAL | Status: DC | PRN

## 2015-10-21 MED ORDER — DIPHENHYDRAMINE HCL 25 MG PO CAPS: 25.0000 mg | ORAL_CAPSULE | Freq: Four times a day (QID) | ORAL | Status: DC | PRN

## 2015-10-21 MED ORDER — WITCH HAZEL-GLYCERIN EX PADS: 1.0000 "application " | MEDICATED_PAD | CUTANEOUS | Status: DC | PRN

## 2015-10-21 MED ORDER — SODIUM CHLORIDE 0.9% FLUSH: 3.0000 mL | INTRAVENOUS | Status: DC | PRN

## 2015-10-21 MED ORDER — DEXAMETHASONE SODIUM PHOSPHATE 4 MG/ML IJ SOLN
INTRAMUSCULAR | Status: DC | PRN
  Administered 2015-10-21: 4 mg via INTRAVENOUS

## 2015-10-21 MED ORDER — MENTHOL 3 MG MT LOZG: 1.0000 | LOZENGE | OROMUCOSAL | Status: DC | PRN

## 2015-10-21 MED ORDER — OXYCODONE HCL 5 MG PO TABS
5.0000 mg | ORAL_TABLET | ORAL | Status: DC | PRN
  Administered 2015-10-24: 5 mg via ORAL
  Filled 2015-10-21: qty 1

## 2015-10-21 MED ORDER — KETOROLAC TROMETHAMINE 30 MG/ML IJ SOLN
30.0000 mg | Freq: Four times a day (QID) | INTRAMUSCULAR | Status: DC | PRN
  Administered 2015-10-21: 30 mg via INTRAMUSCULAR

## 2015-10-21 MED ORDER — PHENYLEPHRINE 8 MG IN D5W 100 ML (0.08MG/ML) PREMIX OPTIME
INJECTION | INTRAVENOUS | Status: AC
  Filled 2015-10-21: qty 100

## 2015-10-21 MED ORDER — ACETAMINOPHEN 325 MG PO TABS
650.0000 mg | ORAL_TABLET | ORAL | Status: DC | PRN
  Administered 2015-10-22 – 2015-10-24 (×5): 650 mg via ORAL
  Filled 2015-10-21 (×5): qty 2

## 2015-10-21 MED ORDER — OXYTOCIN 10 UNIT/ML IJ SOLN
40.0000 [IU] | INTRAVENOUS | Status: DC | PRN
  Administered 2015-10-21: 40 [IU] via INTRAVENOUS

## 2015-10-21 MED ORDER — DIBUCAINE 1 % RE OINT: 1.0000 "application " | TOPICAL_OINTMENT | RECTAL | Status: DC | PRN

## 2015-10-21 MED ORDER — FENTANYL CITRATE (PF) 100 MCG/2ML IJ SOLN
INTRAMUSCULAR | Status: DC | PRN
  Administered 2015-10-21: 10 ug via INTRATHECAL

## 2015-10-21 MED ORDER — PROMETHAZINE HCL 25 MG/ML IJ SOLN: 6.2500 mg | INTRAMUSCULAR | Status: DC | PRN

## 2015-10-21 MED ORDER — NALBUPHINE HCL 10 MG/ML IJ SOLN: 5.0000 mg | INTRAMUSCULAR | Status: DC | PRN

## 2015-10-21 MED ORDER — DIPHENHYDRAMINE HCL 25 MG PO CAPS: 25.0000 mg | ORAL_CAPSULE | ORAL | Status: DC | PRN

## 2015-10-21 MED ORDER — ONDANSETRON HCL 4 MG/2ML IJ SOLN
INTRAMUSCULAR | Status: AC
  Filled 2015-10-21: qty 2

## 2015-10-21 MED ORDER — SCOPOLAMINE 1 MG/3DAYS TD PT72
1.0000 | MEDICATED_PATCH | Freq: Once | TRANSDERMAL | Status: DC
  Filled 2015-10-21: qty 1

## 2015-10-21 MED ORDER — SODIUM CHLORIDE 0.9 % IR SOLN
Status: DC | PRN
  Administered 2015-10-21: 1

## 2015-10-21 MED ORDER — LACTATED RINGERS IV SOLN
Freq: Once | INTRAVENOUS | Status: AC
  Administered 2015-10-21: 1000 mL/h via INTRAVENOUS

## 2015-10-21 MED ORDER — SENNOSIDES-DOCUSATE SODIUM 8.6-50 MG PO TABS
2.0000 | ORAL_TABLET | ORAL | Status: DC
  Administered 2015-10-21 – 2015-10-23 (×3): 2 via ORAL
  Filled 2015-10-21 (×3): qty 2

## 2015-10-21 MED ORDER — SCOPOLAMINE 1 MG/3DAYS TD PT72
MEDICATED_PATCH | TRANSDERMAL | Status: AC
  Administered 2015-10-21: 1.5 mg via TRANSDERMAL
  Filled 2015-10-21: qty 1

## 2015-10-21 MED ORDER — KETOROLAC TROMETHAMINE 30 MG/ML IJ SOLN: 30.0000 mg | Freq: Four times a day (QID) | INTRAMUSCULAR | Status: DC | PRN

## 2015-10-21 MED ORDER — SIMETHICONE 80 MG PO CHEW
80.0000 mg | CHEWABLE_TABLET | Freq: Three times a day (TID) | ORAL | Status: DC
  Administered 2015-10-21 – 2015-10-24 (×7): 80 mg via ORAL
  Filled 2015-10-21 (×7): qty 1

## 2015-10-21 SURGICAL SUPPLY — 35 items
BENZOIN TINCTURE PRP APPL 2/3 (GAUZE/BANDAGES/DRESSINGS) ×2 IMPLANT
CHLORAPREP W/TINT 26ML (MISCELLANEOUS) ×2 IMPLANT
CLAMP CORD UMBIL (MISCELLANEOUS) IMPLANT
CLOSURE STERI STRIP 1/2 X4 (GAUZE/BANDAGES/DRESSINGS) ×2 IMPLANT
CLOTH BEACON ORANGE TIMEOUT ST (SAFETY) ×2 IMPLANT
CONTAINER PREFILL 10% NBF 15ML (MISCELLANEOUS) IMPLANT
DRSG OPSITE POSTOP 4X10 (GAUZE/BANDAGES/DRESSINGS) ×2 IMPLANT
ELECT REM PT RETURN 9FT ADLT (ELECTROSURGICAL) ×2
ELECTRODE REM PT RTRN 9FT ADLT (ELECTROSURGICAL) ×1 IMPLANT
EXTRACTOR VACUUM M CUP 4 TUBE (SUCTIONS) IMPLANT
GLOVE BIO SURGEON STRL SZ8 (GLOVE) ×2 IMPLANT
GLOVE BIOGEL PI IND STRL 7.0 (GLOVE) ×1 IMPLANT
GLOVE BIOGEL PI INDICATOR 7.0 (GLOVE) ×1
GOWN STRL REUS W/TWL LRG LVL3 (GOWN DISPOSABLE) ×4 IMPLANT
KIT ABG SYR 3ML LUER SLIP (SYRINGE) ×2 IMPLANT
LIQUID BAND (GAUZE/BANDAGES/DRESSINGS) IMPLANT
NEEDLE HYPO 25X5/8 SAFETYGLIDE (NEEDLE) ×2 IMPLANT
NS IRRIG 1000ML POUR BTL (IV SOLUTION) ×2 IMPLANT
PACK C SECTION WH (CUSTOM PROCEDURE TRAY) ×2 IMPLANT
PAD ABD 7.5X8 STRL (GAUZE/BANDAGES/DRESSINGS) ×2 IMPLANT
PAD OB MATERNITY 4.3X12.25 (PERSONAL CARE ITEMS) ×2 IMPLANT
PENCIL SMOKE EVAC W/HOLSTER (ELECTROSURGICAL) ×2 IMPLANT
SPONGE DRAIN TRACH 4X4 STRL 2S (GAUZE/BANDAGES/DRESSINGS) ×4 IMPLANT
STRIP CLOSURE SKIN 1/2X4 (GAUZE/BANDAGES/DRESSINGS) IMPLANT
SUT CHROMIC 2 0 SH (SUTURE) ×2 IMPLANT
SUT MNCRL 0 VIOLET CTX 36 (SUTURE) ×4 IMPLANT
SUT MONOCRYL 0 CTX 36 (SUTURE) ×4
SUT PDS AB 0 CTX 60 (SUTURE) ×4 IMPLANT
SUT PLAIN 0 NONE (SUTURE) IMPLANT
SUT PLAIN 2 0 (SUTURE)
SUT PLAIN 2 0 XLH (SUTURE) ×2 IMPLANT
SUT PLAIN ABS 2-0 CT1 27XMFL (SUTURE) IMPLANT
SUT VIC AB 4-0 KS 27 (SUTURE) ×2 IMPLANT
TOWEL OR 17X24 6PK STRL BLUE (TOWEL DISPOSABLE) ×2 IMPLANT
TRAY FOLEY CATH SILVER 14FR (SET/KITS/TRAYS/PACK) ×2 IMPLANT

## 2015-10-21 NOTE — Lactation Note (Signed)
This note was copied from a baby's chart. Lactation Consultation Note Initial visit at 8 hours of age.  Mom has older child that never latched well and she attempted for about 1 week and her breasts never filled with milk.  Mom has a history of infertility and reports minimal breast changes with nipples darkening.  Mom has evert short nipples that tuck in with compression.  Mom is able to return demonstration of hand expression with colostrum drops noted. Lc gave mom hand pump with instructions to use prior to latch.  Lc encouraged hand expression prior to latch as well and to help increase supply.  Discussed possibly using DEBP to increase stimulation due to history.  Mom is recovering from c/s and reports a few good latches with RN getting LATCH scores of "8."  [facility] LC resources given and discussed.  Encouraged to feed with early cues on demand.  Early newborn behavior discussed.  Mom to call for assist as needed.    Patient Name: Mikayla Humphrey [id number] Date: 10/21/2015 Reason for consult: Initial assessment   Maternal Data Has patient been taught Hand Expression?: Yes Does the patient have breastfeeding experience prior to this delivery?: Yes  Feeding Feeding Type: Breast Fed Length of feed: 20 min  LATCH Score/Interventions                      Lactation Tools Discussed/Used     Consult Status Consult Status: Follow-up Date: 10/22/15 Follow-up type: In-patient    Justice Britain 10/21/2015, 4:47 PM

## 2015-10-21 NOTE — Lactation Note (Addendum)
This note was copied from a baby's chart. Lactation Consultation Note Follow up visit at 14 hours of age.  Rn, Collie Siad requested assist due to clicking sound with nursing.  LC arrived after about 10 minutes of feeding and baby appeared to have a good latch and mom reports only having clicking at first. [place] reviewed waking techniques and making sure baby has wide open mouth for deep latch and lips flanged during feeding. LC reviewed basics and encouraged mom to use hand pump prior to latching.   Baby has semi rhythmic sucking with intermittent stimulation needed to maintain feeding of additional 10 minutes.  Baby unlatched to observed nipple and allow mom practice with latching.  Nipple is round with slight compression on tip mom reports minimal pinching pain with latch.  Mom independently latched baby well and baby continues feeding.  LC to follow  Up tomorrow.   Patient Name: Mikayla Humphrey [id number] Date: 10/21/2015 Reason for consult: Follow-up assessment   Maternal Data    Feeding Feeding Type: Breast Fed  LATCH Score/Interventions Latch: Repeated attempts needed to sustain latch, nipple held in mouth throughout feeding, stimulation needed to elicit sucking reflex. Intervention(s): Adjust position;Assist with latch;Breast massage;Breast compression  Audible Swallowing: A few with stimulation Intervention(s): Skin to skin;Hand expression;Alternate breast massage  Type of Nipple: Everted at rest and after stimulation (short shaft)  Comfort (Breast/Nipple): Soft / non-tender     Hold (Positioning): Assistance needed to correctly position infant at breast and maintain latch. Intervention(s): Breastfeeding basics reviewed;Support Pillows;Position options;Skin to skin  LATCH Score: 7  Lactation Tools Discussed/Used     Consult Status Consult Status: Follow-up    Alvin Rubano, Justine Null 10/21/2015, 10:37 PM

## 2015-10-21 NOTE — Transfer of Care (Addendum)
Immediate Anesthesia Transfer of Care Note  Patient: Mikayla Humphrey  Procedure(s) Performed: Procedure(s) with comments: REPEAT CESAREAN SECTION (N/A) - Requested RNFA  Tracey T  Patient Location: PACU  Anesthesia Type:Spinal  Level of Consciousness: awake, alert , oriented and patient cooperative  Airway & Oxygen Therapy: Patient Spontanous Breathing  Post-op Assessment: Report given to RN and Post -op Vital signs reviewed and stable  Post vital signs: Reviewed and stable  Last Vitals:  TEMP 97.5 BP 120/80 RR 17 POX 100  Last Pain: 0    Patients Stated Pain Goal: 3 ([id number])  Complications: No apparent anesthesia complications

## 2015-10-21 NOTE — Op Note (Signed)
Mikayla Humphrey, Mikayla Humphrey             ACCOUNT NO.:  [id number]  MEDICAL RECORD NO.:  [id number]  LOCATION:                                 FACILITY:  PHYSICIAN:  Daleen Bo. Gaetano Net, M.D. DATE OF BIRTH:  [birth year]  DATE OF PROCEDURE: DATE OF DISCHARGE:                              OPERATIVE REPORT   PREOPERATIVE DIAGNOSIS:  Previous cesarean section, desires repeat.  POSTOPERATIVE DIAGNOSIS:  Previous cesarean section, desires repeat.  PROCEDURE:  Repeat low-transverse cesarean section.  SURGEON:  Daleen Bo. Gaetano Net, MD.  ANESTHESIA:  SpinalMidge Minium, MD.  ESTIMATED BLOOD LOSS:  500 mL.  FINDINGS:  Viable female infant.  Apgars, arterial cord pH, birth weight pending.  INDICATIONS AND CONSENT:  This patient is a 35 year old, G4, P2, at term with previous cesarean section.  Potential risks and complications were discussed preoperatively including, but limited to, infection, organ damage, bleeding requiring transfusion of blood products with HIV and hepatitis acquisition, DVT, PE, pneumonia.  All questions were answered. The patient states she understands and consent was signed on the chart.  DESCRIPTION OF PROCEDURE:  The patient was taken to the operating room where she was identified.  Spinal anesthetic was placed and she was placed in a dorsal supine position with 15-degree left lateral wedge. She was prepped.  Foley catheter was placed.  The bladder was drained and draped in sterile fashion per [facility] protocol.  Time-out undertaken.  After testing for adequate spinal anesthesia, skin was entered through the previous Pfannenstiel scar and dissection was carried out in layers to the peritoneum.  Peritoneum was incised, extended superiorly and inferiorly.  The vesicouterine peritoneum was taken down cephalad laterally.  Uterus was incised in a low transverse manner.  The uterine cavity is entered bluntly with a hemostat.  Clear fluid was noted.  The  incision was extended with the fingers.  Vertex was delivered without difficulty.  Good cry and tone were noted after the baby was delivered.  Cord was clamped and cut.  The baby was handed to awaiting pediatrics team.  Placenta was manually delivered.  Cavity was clean.  Uterus was closed in 2 running locking imbricating layers of 0 Monocryl suture.  A 2-0 chromic in the center of the incision was used to obtain complete hemostasis.  Lavage was carried out.  Anterior peritoneum was closed in running fashion with 0 Monocryl suture which was also used to reapproximate the pyramidalis muscle in midline. Anterior rectus fascia was closed with a 0 looped PDS starting at each angle and meeting in the middle.  Subcutaneous layers closed with interrupted plain and the skin was closed with 4-0 Vicryl in subcuticular fashion on a Keith needle.  Benzoin and Steri-Strips pressure dressing was applied. All counts were correct and the patient was taken to recovery room in stable condition.     Daleen Bo Gaetano Net, M.D.   ______________________________ Daleen Bo. Gaetano Net, M.D.    JET/MEDQ  D:  10/21/2015  T:  10/21/2015  Job:  [id number]

## 2015-10-21 NOTE — Anesthesia Postprocedure Evaluation (Signed)
Anesthesia Post Note  Patient: Mikayla Humphrey  Procedure(s) Performed: Procedure(s) (LRB): REPEAT CESAREAN SECTION (N/A)  Patient location during evaluation: PACU Anesthesia Type: Spinal Level of consciousness: awake and alert, oriented and patient cooperative Pain management: pain level controlled Vital Signs Assessment: post-procedure vital signs reviewed and stable Respiratory status: spontaneous breathing, nonlabored ventilation and respiratory function stable Cardiovascular status: blood pressure returned to baseline and stable Postop Assessment: no headache, no backache, spinal receding and patient able to bend at knees Anesthetic complications: no     Last Vitals:  Filed Vitals:   10/21/15 1030 10/21/15 1125  BP: 131/79 126/75  Pulse:  79  Temp:  36.6 C  Resp:  18    Last Pain:  Filed Vitals:   10/21/15 1152  PainSc: 1    Pain Goal: Patients Stated Pain Goal: 3 (10/21/15 1030)               Rindy Kollman,E. Kynedi Profitt

## 2015-10-21 NOTE — Lactation Note (Signed)
This note was copied from a baby's chart. Lactation Consultation Note Follow up visit at 11 hours of age.  Mom requesting visit at 11 hours of age.  Mom is holding baby in cradle hold and baby just finished a 5 minutes feeding.  Lc assisted with cross cradle and baby is to tired to keep feeding.  Baby remains close with mom on chest.  Mom will call again as needed.    Patient Name: Girl Tyaira Cafaro [id number] Date: 10/21/2015 Reason for consult: Initial assessment   Maternal Data Has patient been taught Hand Expression?: Yes Does the patient have breastfeeding experience prior to this delivery?: Yes  Feeding    LATCH Score/Interventions                      Lactation Tools Discussed/Used     Consult Status Consult Status: Follow-up Date: 10/22/15 Follow-up type: In-patient    Justice Britain 10/21/2015, 7:07 PM

## 2015-10-21 NOTE — Progress Notes (Signed)
No change to H&P per patient history Reviewed with patient procedure-repeat cesarean section All questions answered Patient states she understands and agrees 

## 2015-10-21 NOTE — Brief Op Note (Signed)
10/21/2015  8:36 AM  PATIENT:  Dalene Seltzer  35 y.o. female  PRE-OPERATIVE DIAGNOSIS:  REPEAT CESAREAN SECTION  POST-OPERATIVE DIAGNOSIS:  REPEAT CESAREAN SECTION  PROCEDURE:  Procedure(s) with comments: REPEAT CESAREAN SECTION (N/A) - Requested RNFA  Tracey T  SURGEON:  Surgeon(s) and Role:    * Everlene Farrier, MD - Primary  PHYSICIAN ASSISTANT:   ASSISTANTS: none   ANESTHESIA:   spinal  EBL:  Total I/O In: [id number] [I.V.:3250; IV Piggyback:117] Out: 750 [Urine:150; Blood:600]  BLOOD ADMINISTERED:none  DRAINS: Urinary Catheter (Foley)   LOCAL MEDICATIONS USED:  NONE  SPECIMEN:  No Specimen  DISPOSITION OF SPECIMEN:  N/A  COUNTS:  YES  TOURNIQUET:  * No tourniquets in log *  DICTATION: .Other Dictation: Dictation Number [id number]  PLAN OF CARE: Admit to inpatient   PATIENT DISPOSITION:  PACU - hemodynamically stable.   Delay start of Pharmacological VTE agent (>24hrs) due to surgical blood loss or risk of bleeding: not applicable

## 2015-10-21 NOTE — Anesthesia Procedure Notes (Signed)
Spinal Patient location during procedure: OR End time: 10/21/2015 7:41 AM Staffing Anesthesiologist: Annye Asa Performed by: anesthesiologist  Preanesthetic Checklist Completed: patient identified, site marked, surgical consent, pre-op evaluation, timeout performed, IV checked, risks and benefits discussed and monitors and equipment checked Spinal Block Patient position: sitting Prep: Betadine, site prepped and draped and DuraPrep Patient monitoring: heart rate, continuous pulse ox, blood pressure and cardiac monitor Approach: midline Location: L3-4 Injection technique: single-shot Needle Needle type: Quincke  Needle gauge: 25 G Needle length: 9 cm Additional Notes Pt identified in Operating room.  Monitors applied. Working IV access confirmed. Sterile prep, drape lumbar spine.  1% lido local L 3,4.  #25ga Quincke into clear CSF L 3,4.  12mg  0.75% Bupivacaine with dextrose, 10 mcg fentanyl, 0.2 mg morphine injected with asp CSF beginning and end of injection.  Patient asymptomatic, VSS, no heme aspirated, tolerated well.  Jenita Seashore, MD

## 2015-10-21 NOTE — Anesthesia Preprocedure Evaluation (Signed)
Anesthesia Evaluation  Patient identified by MRN, date of birth, ID band Patient awake    Reviewed: Allergy & Precautions, NPO status , Patient's Chart, lab work & pertinent test results  History of Anesthesia Complications Negative for: history of anesthetic complications  Airway Mallampati: II  TM Distance: >3 FB Neck ROM: Full    Dental  (+) Dental Advisory Given   Pulmonary neg pulmonary ROS,    breath sounds clear to auscultation       Cardiovascular negative cardio ROS   Rhythm:Regular Rate:Normal     Neuro/Psych negative neurological ROS     GI/Hepatic Neg liver ROS, GERD  Poorly Controlled,  Endo/Other  Morbid obesity  Renal/GU negative Renal ROS     Musculoskeletal negative musculoskeletal ROS (+)   Abdominal (+) + obese,   Peds  Hematology plt 160K   Anesthesia Other Findings   Reproductive/Obstetrics (+) Pregnancy (for repeat C-section)                             Anesthesia Physical Anesthesia Plan  ASA: II  Anesthesia Plan: Spinal   Post-op Pain Management:    Induction:   Airway Management Planned: Natural Airway  Additional Equipment:   Intra-op Plan:   Post-operative Plan:   Informed Consent: I have reviewed the patients History and Physical, chart, labs and discussed the procedure including the risks, benefits and alternatives for the proposed anesthesia with the patient or authorized representative who has indicated his/her understanding and acceptance.   Dental advisory given  Plan Discussed with: CRNA and Surgeon  Anesthesia Plan Comments: (Plan routine monitors, SAB)        Anesthesia Quick Evaluation

## 2015-10-22 LAB — CBC
HCT: 28.5 % — ABNORMAL LOW (ref 36.0–46.0)
HEMOGLOBIN: 9.1 g/dL — AB (ref 12.0–15.0)
MCH: 27 pg (ref 26.0–34.0)
MCHC: 31.9 g/dL (ref 30.0–36.0)
MCV: 84.6 fL (ref 78.0–100.0)
Platelets: 146 10*3/uL — ABNORMAL LOW (ref 150–400)
RBC: 3.37 MIL/uL — AB (ref 3.87–5.11)
RDW: 17.4 % — ABNORMAL HIGH (ref 11.5–15.5)
WBC: 8.3 10*3/uL (ref 4.0–10.5)

## 2015-10-22 LAB — BIRTH TISSUE RECOVERY COLLECTION (PLACENTA DONATION)

## 2015-10-22 NOTE — Lactation Note (Signed)
This note was copied from a baby's chart. Lactation Consultation Note  Patient Name: Mikayla Humphrey [id number] Date: 10/22/2015 Reason for consult: Follow-up assessment  Baby 1 hours old. Mom reports that baby has been sleepy at breast, and she has had some nipple soreness. Mom reports that the discomfort is just at the beginning of BF. Assisted mom to latch baby to left breast in football position. Baby sleepy at breast. Undressed baby and baby began cueing to nurse. Assisted mom to hand express a few drops of colostrum from left breast and baby latched deeply, suckled rhythmically with a few swallows noted. Mom had initial discomfort for about 15 seconds. Demonstrated how to flange baby's lower lip and mom reported increased comfort. Enc mom to use EBM on nipples after nursing. Enc mom to offer lots of STS and nurse with cues.  Maternal Data    Feeding Feeding Type: Breast Fed Length of feed:  (LC assessed first 10 minutes of BF.)  LATCH Score/Interventions Latch: Grasps breast easily, tongue down, lips flanged, rhythmical sucking. Intervention(s): Skin to skin;Teach feeding cues;Waking techniques Intervention(s): Adjust position;Assist with latch;Breast compression  Audible Swallowing: A few with stimulation Intervention(s): Skin to skin;Hand expression  Type of Nipple: Everted at rest and after stimulation  Comfort (Breast/Nipple): Soft / non-tender     Hold (Positioning): Assistance needed to correctly position infant at breast and maintain latch. Intervention(s): Breastfeeding basics reviewed;Support Pillows;Position options;Skin to skin  LATCH Score: 8  Lactation Tools Discussed/Used     Consult Status Consult Status: Follow-up Date: 10/23/15 Follow-up type: In-patient    Inocente Salles 10/22/2015, 6:42 PM

## 2015-10-22 NOTE — Progress Notes (Signed)
Subjective: Postpartum Day 1: Cesarean Delivery Patient reports tolerating PO and no problems voiding.    Objective: Vital signs in last 24 hours: Temp:  [97.8 F (36.6 C)-98.7 F (37.1 C)] 98.7 F (37.1 C) (07/15 0547) Pulse Rate:  [68-98] 75 (07/15 0547) Resp:  [18-20] 18 (07/15 0547) BP: (98-126)/(52-83) 109/60 mmHg (07/15 0547) SpO2:  [95 %-98 %] 98 % (07/15 0547)  Physical Exam:  General: alert, cooperative and appears stated age Lochia: appropriate Uterine Fundus: firm Incision: healing well, no significant drainage, no dehiscence DVT Evaluation: No evidence of DVT seen on physical exam.   Recent Labs  10/20/15 1129 10/22/15 0627  HGB 11.0* 9.1*  HCT 34.8* 28.5*    Assessment/Plan: Status post Cesarean section. Doing well postoperatively.  Continue current care.  Dianna Ewald, [place] 10/22/2015, 10:38 AM

## 2015-10-23 ENCOUNTER — Encounter (HOSPITAL_COMMUNITY): Payer: Self-pay | Admitting: *Deleted

## 2015-10-23 NOTE — Progress Notes (Signed)
Subjective: Postpartum Day 2: Cesarean Delivery Patient reports tolerating PO, + flatus and no problems voiding.    Objective: Vital signs in last 24 hours: Temp:  [98 F (36.7 C)-99.3 F (37.4 C)] 98 F (36.7 C) (07/16 0500) Pulse Rate:  [75-83] 75 (07/16 0500) Resp:  [18] 18 (07/16 0500) BP: (123-133)/(68-70) 123/70 mmHg (07/16 0500)  Physical Exam:  General: alert, cooperative and appears stated age 35: appropriate Uterine Fundus: firm Incision: healing well, no significant drainage, no dehiscence DVT Evaluation: No evidence of DVT seen on physical exam. Negative Homan's sign. No cords or calf tenderness.   Recent Labs  10/20/15 1129 10/22/15 0627  HGB 11.0* 9.1*  HCT 34.8* 28.5*    Assessment/Plan: Status post Cesarean section. Doing well postoperatively.  Continue current care.  Mikayla Humphrey 10/23/2015, 10:50 AM

## 2015-10-23 NOTE — Lactation Note (Signed)
This note was copied from a baby's chart. Lactation Consultation Note  Baby latched upon entering on L side.  Helped mother reposition to football hold on R side. With compression mother was able to get a deeper more comfortable.  Helped w/ chin tug. Discussed basics and applying either ebm or coconut oil to nipples for soreness.  Patient Name: Mikayla Humphrey [id number] Date: 10/23/2015 Reason for consult: Follow-up assessment   Maternal Data    Feeding Feeding Type: Breast Fed  LATCH Score/Interventions Latch: Grasps breast easily, tongue down, lips flanged, rhythmical sucking. Intervention(s): Skin to skin;Waking techniques Intervention(s): Adjust position;Breast massage;Assist with latch;Breast compression  Audible Swallowing: A few with stimulation Intervention(s): Hand expression Intervention(s): Alternate breast massage  Type of Nipple: Everted at rest and after stimulation  Comfort (Breast/Nipple): Soft / non-tender     Hold (Positioning): Assistance needed to correctly position infant at breast and maintain latch.  LATCH Score: 8  Lactation Tools Discussed/Used     Consult Status Consult Status: Follow-up Date: 10/24/15 Follow-up type: In-patient    [name] [facility] 10/23/2015, 2:03 PM

## 2015-10-24 MED ORDER — IBUPROFEN 600 MG PO TABS: 600.0000 mg | ORAL_TABLET | Freq: Four times a day (QID) | ORAL | Status: DC | PRN

## 2015-10-24 MED ORDER — OXYCODONE HCL 10 MG PO TABS: 10.0000 mg | ORAL_TABLET | ORAL | Status: DC | PRN

## 2015-10-24 NOTE — Discharge Summary (Signed)
Obstetric Discharge Summary Reason for Admission: cesarean section Prenatal Procedures: none Intrapartum Procedures: cesarean: low cervical, transverse Postpartum Procedures: none Complications-Operative and Postpartum: none HEMOGLOBIN  Date Value Ref Range Status  10/22/2015 9.1* 12.0 - 15.0 g/dL Final   HCT  Date Value Ref Range Status  10/22/2015 28.5* 36.0 - 46.0 % Final    Physical Exam:  General: alert Lochia: appropriate Uterine Fundus: firm Incision: healing well DVT Evaluation: No evidence of DVT seen on physical exam.  Discharge Diagnoses: Term Pregnancy-delivered  Discharge Information: Date: 10/24/2015 Activity: pelvic rest Diet: routine Medications: PNV, Ibuprofen and Percocet Condition: stable Instructions: refer to practice specific booklet Discharge to: home Follow-up Information    Follow up with [facility]. Schedule an appointment as soon as possible for a visit in 1 week.   Contact information:   [address] [phone]       Newborn Data: Live born female  Birth Weight: 7 lb 12.5 oz (3530 g) APGAR: 8, 8  Home with mother.  Neita Landrigan M 10/24/2015, 8:29 AM

## 2015-10-24 NOTE — Lactation Note (Signed)
This note was copied from a baby's chart. Lactation Consultation Note: Mother states that breastfeeding is going better.  She states that infant still has a tight jaw and she feels some compression on her left nipple.  Mother has coconut oil for discomfort. Mother reports that she supplemented this am due to all the cluster feeding.  Informed  mother that this is normal infant behavior.  Advised mother to post pump with harmony hand pump for 15-20 mins on each breast and supplement infant with ebm.   Mother advised to do good breast massage and ice to soften breast if engorged. Mother is aware of available Lactation services.   Patient Name: Mikayla Humphrey [id number] Date: 10/24/2015 Reason for consult: Follow-up assessment   Maternal Data    Feeding    LATCH Score/Interventions                      Lactation Tools Discussed/Used     Consult Status Consult Status: Complete    Darla Lesches 10/24/2015, 10:36 AM

## 2016-01-03 ENCOUNTER — Emergency Department (HOSPITAL_COMMUNITY)
Admission: EM | Admit: 2016-01-03 | Discharge: 2016-01-03 | Disposition: A | Discharge status: Home / Self Care | Payer: BLUE CROSS/BLUE SHIELD | Attending: Emergency Medicine | Admitting: Emergency Medicine

## 2016-01-03 ENCOUNTER — Emergency Department (HOSPITAL_COMMUNITY): Payer: BLUE CROSS/BLUE SHIELD

## 2016-01-03 ENCOUNTER — Encounter (HOSPITAL_COMMUNITY): Payer: Self-pay | Admitting: Emergency Medicine

## 2016-01-03 DIAGNOSIS — R202 Paresthesia of skin: Secondary | ICD-10-CM | POA: Diagnosis present

## 2016-01-03 DIAGNOSIS — Z5181 Encounter for therapeutic drug level monitoring: Secondary | ICD-10-CM | POA: Diagnosis not present

## 2016-01-03 LAB — CBC
HEMATOCRIT: 38.6 % (ref 36.0–46.0)
HEMOGLOBIN: 11.9 g/dL — AB (ref 12.0–15.0)
MCH: 27.2 pg (ref 26.0–34.0)
MCHC: 30.8 g/dL (ref 30.0–36.0)
MCV: 88.1 fL (ref 78.0–100.0)
Platelets: 217 10*3/uL (ref 150–400)
RBC: 4.38 MIL/uL (ref 3.87–5.11)
RDW: 15.2 % (ref 11.5–15.5)
WBC: 5.8 10*3/uL (ref 4.0–10.5)

## 2016-01-03 LAB — I-STAT CHEM 8, ED
BUN: 15 mg/dL (ref 6–20)
CREATININE: 0.7 mg/dL (ref 0.44–1.00)
Calcium, Ion: 1.03 mmol/L — ABNORMAL LOW (ref 1.15–1.40)
Chloride: 105 mmol/L (ref 101–111)
GLUCOSE: 85 mg/dL (ref 65–99)
HEMATOCRIT: 38 % (ref 36.0–46.0)
HEMOGLOBIN: 12.9 g/dL (ref 12.0–15.0)
POTASSIUM: 3.9 mmol/L (ref 3.5–5.1)
Sodium: 139 mmol/L (ref 135–145)
TCO2: 24 mmol/L (ref 0–100)

## 2016-01-03 LAB — I-STAT TROPONIN, ED: TROPONIN I, POC: 0 ng/mL (ref 0.00–0.08)

## 2016-01-03 LAB — COMPREHENSIVE METABOLIC PANEL
ALK PHOS: 76 U/L (ref 38–126)
ALT: 12 U/L — AB (ref 14–54)
AST: 27 U/L (ref 15–41)
Albumin: 3.9 g/dL (ref 3.5–5.0)
Anion gap: 10 (ref 5–15)
BUN: 13 mg/dL (ref 6–20)
CALCIUM: 9.1 mg/dL (ref 8.9–10.3)
CHLORIDE: 104 mmol/L (ref 101–111)
CO2: 26 mmol/L (ref 22–32)
CREATININE: 0.68 mg/dL (ref 0.44–1.00)
Glucose, Bld: 88 mg/dL (ref 65–99)
Potassium: 3.9 mmol/L (ref 3.5–5.1)
Sodium: 140 mmol/L (ref 135–145)
Total Bilirubin: 0.8 mg/dL (ref 0.3–1.2)
Total Protein: 7.5 g/dL (ref 6.5–8.1)

## 2016-01-03 LAB — PROTIME-INR
INR: 0.96
Prothrombin Time: 12.7 seconds (ref 11.4–15.2)

## 2016-01-03 LAB — DIFFERENTIAL
BASOS ABS: 0 10*3/uL (ref 0.0–0.1)
Basophils Relative: 0 %
Eosinophils Absolute: 0.2 10*3/uL (ref 0.0–0.7)
Eosinophils Relative: 3 %
LYMPHS ABS: 2.3 10*3/uL (ref 0.7–4.0)
LYMPHS PCT: 40 %
MONO ABS: 0.3 10*3/uL (ref 0.1–1.0)
MONOS PCT: 5 %
NEUTROS ABS: 3 10*3/uL (ref 1.7–7.7)
Neutrophils Relative %: 52 %

## 2016-01-03 LAB — APTT: APTT: 30 s (ref 24–36)

## 2016-01-03 LAB — CBG MONITORING, ED: Glucose-Capillary: 85 mg/dL (ref 65–99)

## 2016-01-03 NOTE — ED Triage Notes (Signed)
Pt sts she was feeding her son and he husband told her that her face was drooping. Pt then reports that she began having left sided arm tingling. Pt sts that a few minutes later after going on a walk, it moved to right sided tingling. Pt now sts that she is having tingling and "heaviness" to bilateral arms and head. No weakness appreciated. No facial droop, pt ambulatory, movements symmetrical. Speech clear. Pt sts "I dont know if its my anxiety. "

## 2016-01-03 NOTE — ED Provider Notes (Signed)
[facility] DEPT Provider Note   CSN: [id number] Arrival date & time: 01/03/16  1900  By signing my name below, I, Dora Sims, attest that this documentation has been prepared under the direction and in the presence of physician practitioner, Ripley Fraise, MD. Electronically Signed: Dora Sims, Scribe. 01/03/2016. 11:11 PM.  History   Chief Complaint Chief Complaint  Patient presents with  . Tingling    The history is provided by the patient. No language interpreter was used.     HPI Comments: Mikayla Humphrey is a 35 y.o. female who presents to the Emergency Department complaining of gradual onset, constant, right upper extremity tingling beginning a few hours ago. Pt reports she initially had tingling and "heaviness" in her left arm. She reports she was sitting in a car when her LUE tingling presented; she walked and stretched and her tingling moved to her right bicep. She states her right bicep feels like it is asleep. Pt states her husband told her her face was drooping this afternoon but she did not feel anything and when looking at reflection noted droop noted. She notes she is having some tingling in her right toes, right calf, and the right cheek currently in addition to her right bicep. She also notes some pressure in her head currently but denies headache. She reports she has a two month old child via C-section and denies any complications with that pregnancy. She reports she uses Valtrex "every now and then" but no other medications on a regular basis. Pt denies FMHx of stroke at a young age, personal history of stroke, personal h/o heart attack, or blood pressure problems. She denies experiencing similar symptoms in the past. Pt further denies blurry vision, neck pain, chest pain, abdominal pain, other pain. leg swelling, fever, drooling, trouble swallowing, trouble speaking, dizziness, weakness, or any other associated symptoms.  Past Medical History:  Diagnosis Date    . Abnormal Pap smear 2002  . Anemia   . BV (bacterial vaginosis) 05/07/06  . Chronic pelvic pain in female 02/16/10  . Endometriosis 06/08/2010  . H/O amenorrhea 07/16/11  . H/O dysmenorrhea 11/10/09  . H/O fatigue 2008  . H/O heartburn 07/16/2011  . H/O hemorrhoids   . H/O infertility    IUI pregnancy  . Heartburn in pregnancy   . 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 Hx of bacterial infection   . Hyperlipidemia    diet controlled, no meds  . Irregular periods/menstrual cycles 2008  . Obesity, morbid, BMI 40.0-49.9 ([place])   . Yeast infection     Patient Active Problem List   Diagnosis Date Noted  . S/P cesarean section 03/21/2014  . H/O incompetent cervix, currently pregnant 11/01/2013  . Pregnancy with other poor obstetric history(V23.49) 09/01/2013  . Anal fissure 04/29/2013  . Endometriosis 01/22/2012  . Galactorrhea 11/12/2011    Past Surgical History:  Procedure Laterality Date  . CESAREAN SECTION  2001   [facility]  . CESAREAN SECTION N/A 03/19/2014   Procedure: CESAREAN SECTION;  Surgeon: Allena Katz, MD;  Location: [facility];  Service: Obstetrics;  Laterality: N/A;  repeat  edc 03/26/14  . CESAREAN SECTION N/A 10/21/2015   Procedure: REPEAT CESAREAN SECTION;  Surgeon: Everlene Farrier, MD;  Location: [facility];  Service: Obstetrics;  Laterality: N/A;  Requested RNFA  Tracey T  . DIAGNOSTIC LAPAROSCOPY  2011   endometriosis  . scare tissue     from c/s incision  .  WISDOM TOOTH EXTRACTION      OB History    Gravida Para Term Preterm AB Living   4 4 3 1  0 3   SAB TAB Ectopic Multiple Live Births   0     0 4       Home Medications    Prior to Admission medications   Medication Sig Start Date End Date Taking? Authorizing Provider  ferrous sulfate 325 (65 FE) MG tablet Take 325 mg by mouth daily with breakfast.    Historical Provider, MD  ibuprofen (ADVIL,MOTRIN) 600 MG tablet Take 1 tablet (600 mg  total) by mouth every 6 (six) hours as needed for moderate pain. 10/24/15   Molli Posey, MD  oxyCODONE 10 MG TABS Take 1 tablet (10 mg total) by mouth every 4 (four) hours as needed (pain scale > 7). 10/24/15   Molli Posey, MD    Family History Family History  Problem Relation Age of Onset  . Bipolar disorder Sister   . Hypertension Maternal Grandmother   . Stroke Paternal Grandmother     Social History Social History  Substance Use Topics  . Smoking status: Never Smoker  . Smokeless tobacco: Never Used  . Alcohol use No     Allergies   Amoxicillin   Review of Systems Review of Systems  Constitutional: Negative for fever.  HENT: Negative for drooling and trouble swallowing.   Eyes: Negative for visual disturbance.  Cardiovascular: Negative for chest pain and leg swelling.  Gastrointestinal: Negative for abdominal pain.  Musculoskeletal: Negative for arthralgias, myalgias and neck pain.  Neurological: Positive for facial asymmetry (per husband) and numbness (RUE, previously in LUE). Negative for dizziness, speech difficulty, weakness and headaches.  All other systems reviewed and are negative.   Physical Exam Updated Vital Signs BP 129/69   Pulse 80   Temp 98.2 F (36.8 C)   Resp 14   Ht 5\' 7"  (1.702 m)   Wt 256 lb (116.1 kg)   SpO2 100%   BMI 40.10 kg/m   Physical Exam  CONSTITUTIONAL: Well developed/well nourished HEAD: Normocephalic/atraumatic EYES: EOMI/PERRL, no nystagmus, no ptosis ENMT: Mucous membranes moist NECK: supple no meningeal signs, no bruits CV: S1/S2 noted, no murmurs/rubs/gallops noted LUNGS: Lungs are clear to auscultation bilaterally, no apparent distress ABDOMEN: soft, nontender, no rebound or guarding GU:no cva tenderness NEURO:Awake/alert, face symmetric, no arm or leg drift is noted Equal 5/5 strength with shoulder abduction, elbow flex/extension, wrist flex/extension in upper extremities and equal hand grips  bilaterally Equal 5/5 strength with hip flexion,knee flex/extension, foot dorsi/plantar flexion Cranial nerves 3/4/5/6/10/15/08/11/12 tested and intact Gait normal without ataxia No past pointing Patient reports decreased sensation to right cheek, right bicep, and right calf, but no other significant sensory deficit noted in her body. No clonus. No hyperreflexia in LE noted EXTREMITIES: pulses normal, full ROM. No lower extremity edema. SKIN: warm, color normal PSYCH: no abnormalities of mood noted   ED Treatments / Results  Labs (all labs ordered are listed, but only abnormal results are displayed) Labs Reviewed  CBC - Abnormal; Notable for the following:       Result Value   Hemoglobin 11.9 (*)    All other components within normal limits  COMPREHENSIVE METABOLIC PANEL - Abnormal; Notable for the following:    ALT 12 (*)    All other components within normal limits  I-STAT CHEM 8, ED - Abnormal; Notable for the following:    Calcium, Ion 1.03 (*)    All other  components within normal limits  PROTIME-INR  APTT  DIFFERENTIAL  I-STAT TROPOININ, ED  CBG MONITORING, ED    EKG  EKG Interpretation  Date/Time:  Tuesday January 03 2016 19:04:32 EDT Ventricular Rate:  84 PR Interval:  132 QRS Duration: 82 QT Interval:  384 QTC Calculation: 453 R Axis:   53 Text Interpretation:  Normal sinus rhythm Normal ECG No previous ECGs available Confirmed by Christy Gentles  MD, Mulki Roesler ([id number]) on 01/03/2016 11:09:04 PM       Radiology Ct Head Wo Contrast  Result Date: 01/03/2016 CLINICAL DATA:  Tingling and numbness in upper extremities. EXAM: CT HEAD WITHOUT CONTRAST TECHNIQUE: Contiguous axial images were obtained from the base of the skull through the vertex without intravenous contrast. COMPARISON:  None. FINDINGS: Brain: No evidence of acute infarction, hemorrhage, hydrocephalus, extra-axial collection or mass lesion/mass effect. Vascular: No hyperdense vessel or unexpected calcification.  Skull: Normal. Negative for fracture or focal lesion. Sinuses/Orbits: No acute finding. Other: None. IMPRESSION: 1. No acute intracranial abnormalities.  Normal brain. Electronically Signed   By: Kerby Moors M.D.   On: 01/03/2016 21:50    Procedures Procedures (including critical care time)  DIAGNOSTIC STUDIES: Oxygen Saturation is 100% on RA, normal by my interpretation.    COORDINATION OF CARE: 11:21 PM Discussed treatment plan with pt at bedside and pt agreed to plan.  Medications Ordered in ED Medications - No data to display   Initial Impression / Assessment and Plan / ED Course  I have reviewed the triage vital signs and the nursing notes.  Pertinent labs results that were available during my care of the patient were reviewed by me and considered in my medical decision making (see chart for details).  Clinical Course    Labs/imaging negative No focal weakness on exam She reports numbness but it is in variety of locations on right sided but not throughout right side.  Also initial symptoms started on left side then moved to right side.   This makes stroke less likely.  She is low risk for stroke or other acute neurologic emergency I feel she is appropriate for discharge home We discussed strict return precautions   I personally performed the services described in this documentation, which was scribed in my presence. The recorded information has been reviewed and is accurate.    Final Clinical Impressions(s) / ED Diagnoses   Final diagnoses:  Paresthesia    New Prescriptions New Prescriptions   No medications on file     Ripley Fraise, MD 01/04/16 [id number]

## 2017-10-10 IMAGING — CT CT HEAD W/O CM
3 of 4 series · 18 of 47 positions shown, 21 images · non-contrast
Comparison: None.

CLINICAL DATA: Tingling and numbness in upper extremities.

EXAM:
CT HEAD WITHOUT CONTRAST
TECHNIQUE: Contiguous axial images were obtained from the base of the skull
through the vertex without intravenous contrast.

[Series 201: head w/o, idose (1) · axial · non-contrast · 0.42mm/px · z∈[+1115,+1245]mm · 12 of 32 slices shown, 15 images]
[im 3/32  brain]
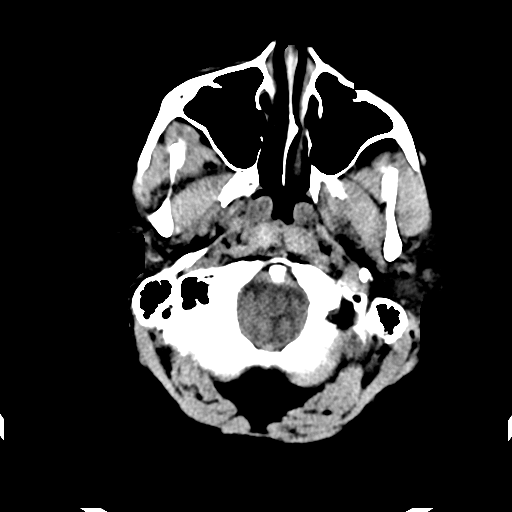
[im 3/32  bone]
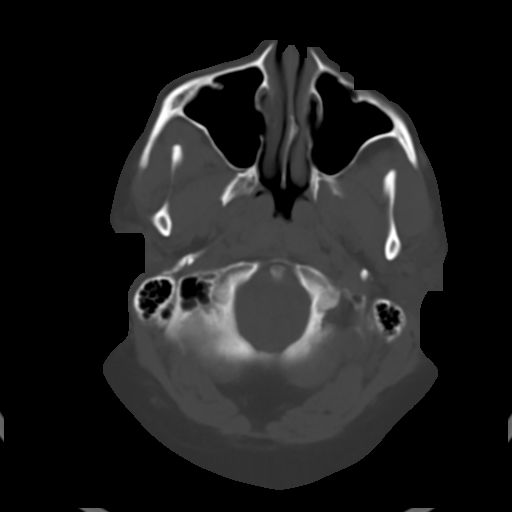
[im 5/32  brain]
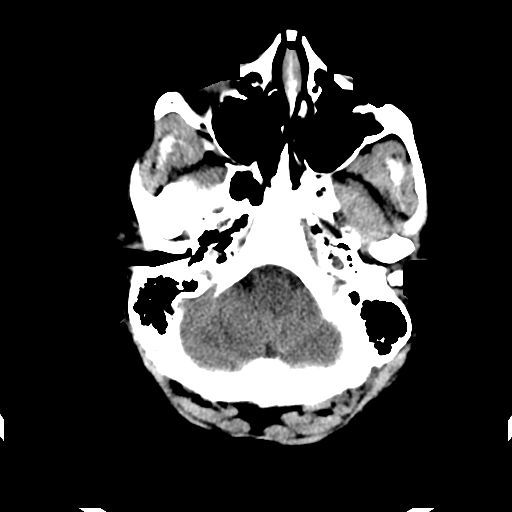
[im 7/32  brain]
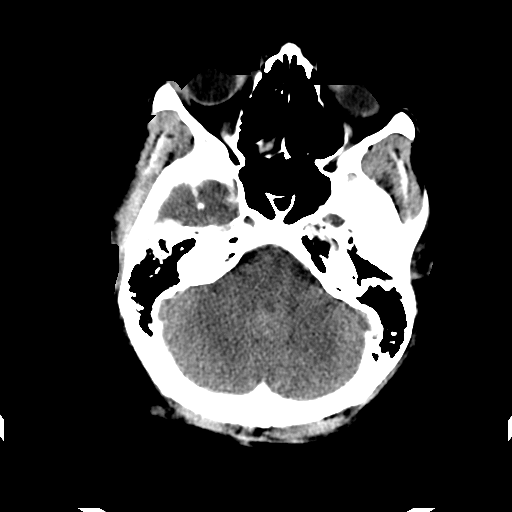
[im 9/32  brain]
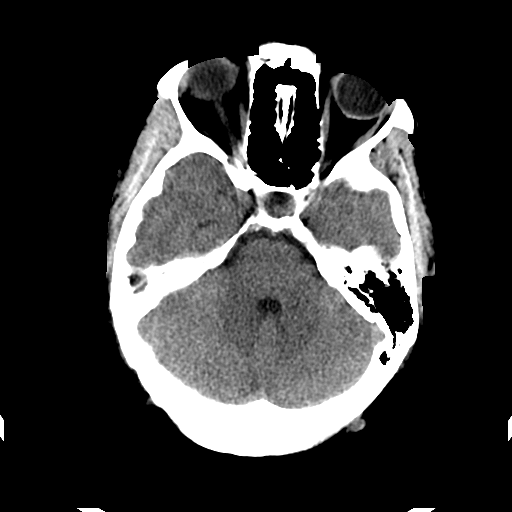
[im 12/32  brain]
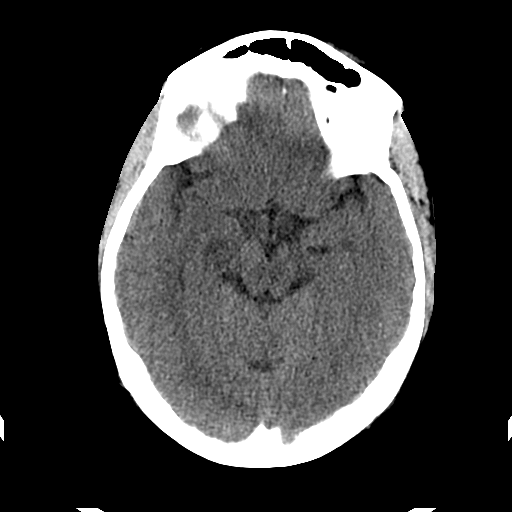
[im 12/32  bone]
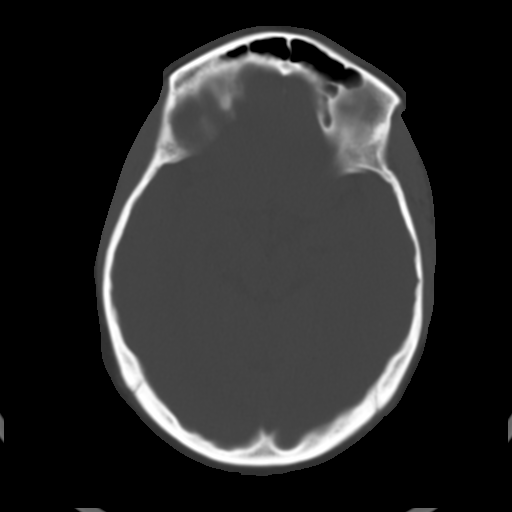
[im 14/32  brain]
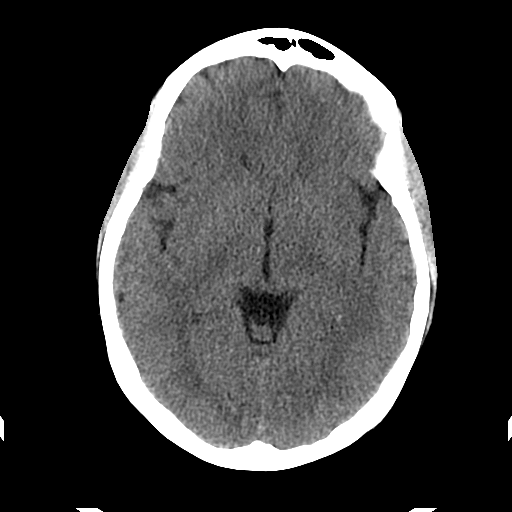
[im 18/32  brain]
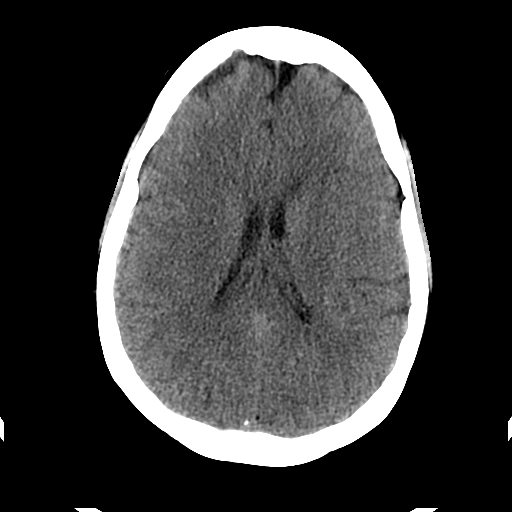
[im 20/32  brain]
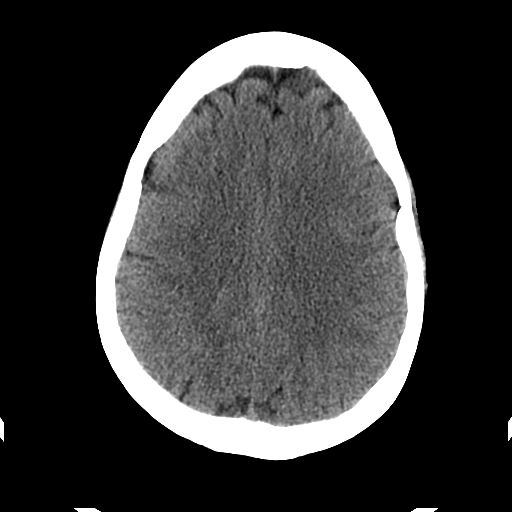
[im 23/32  brain]
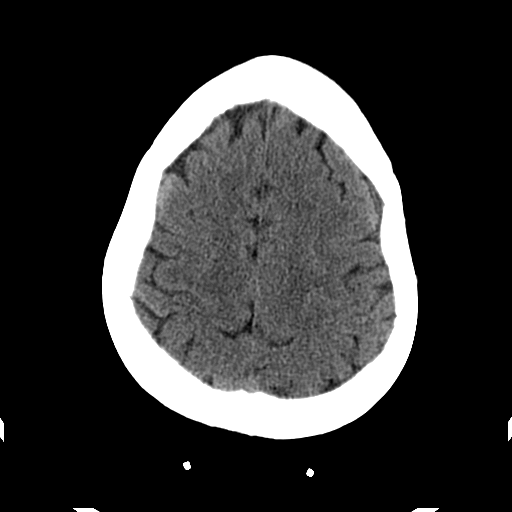
[im 23/32  bone]
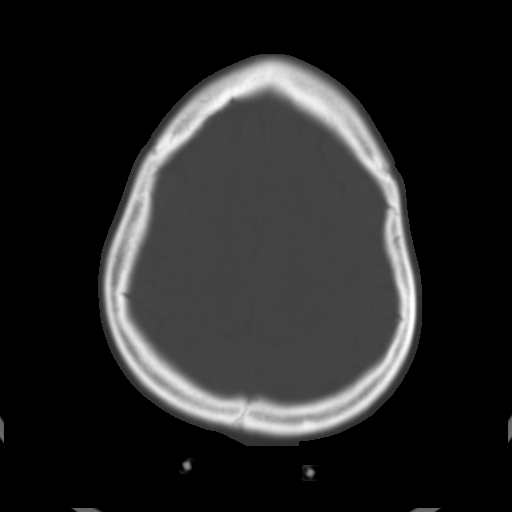
[im 25/32  brain]
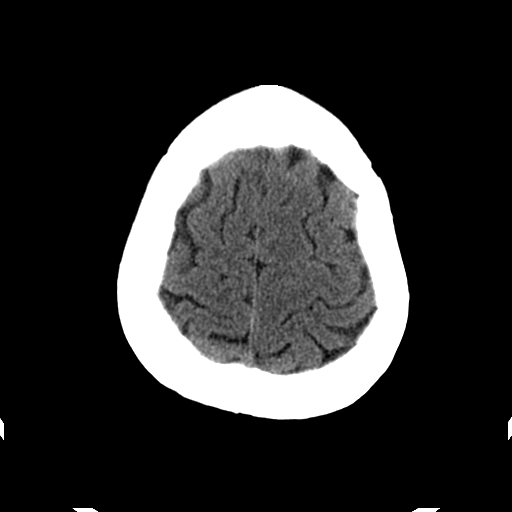
[im 27/32  brain]
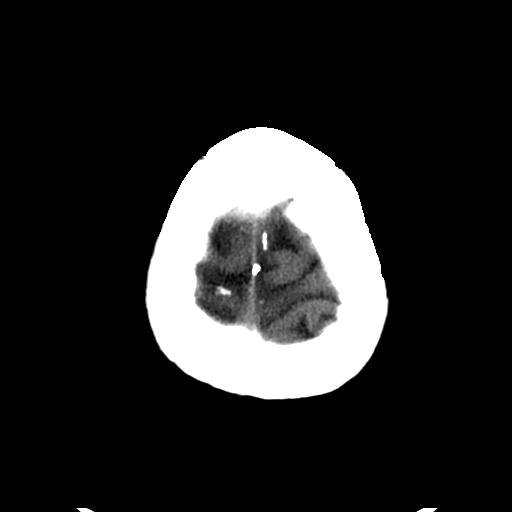
[im 29/32  brain]
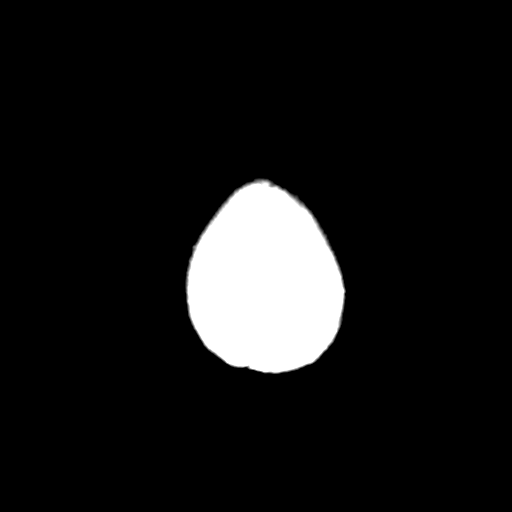

[Series 203: coronal st, idose (1) · coronal · 0.40mm/px · 3 of 72 slices shown]
[im 24/72  brain]
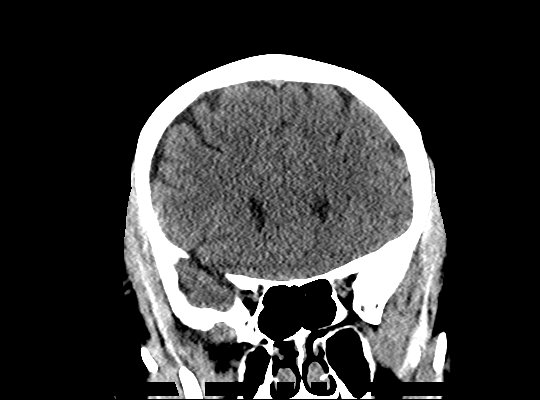
[im 32/72  brain]
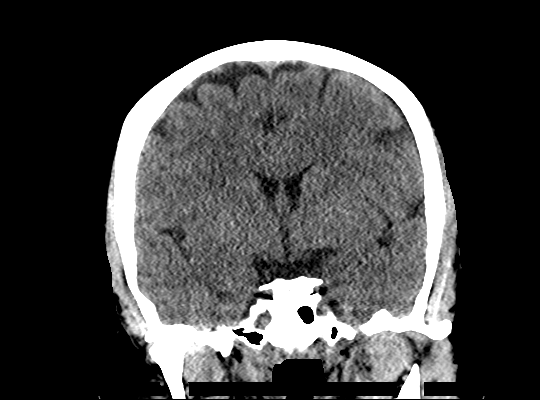
[im 40/72  brain]
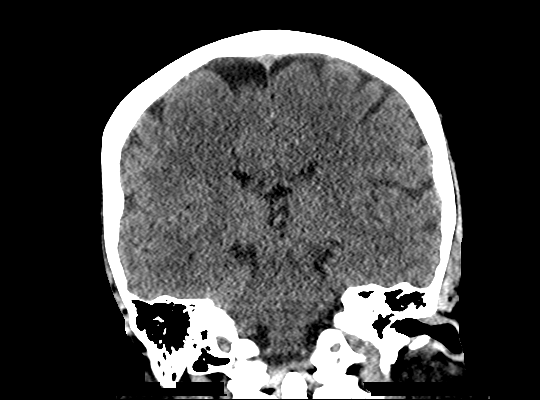

[Series 204: sagittal st, idose (1) · sagittal · 0.40mm/px · 3 of 72 slices shown]
[im 24/72  brain]
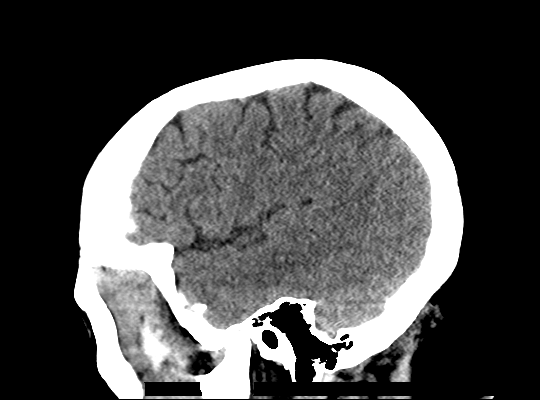
[im 36/72  brain]
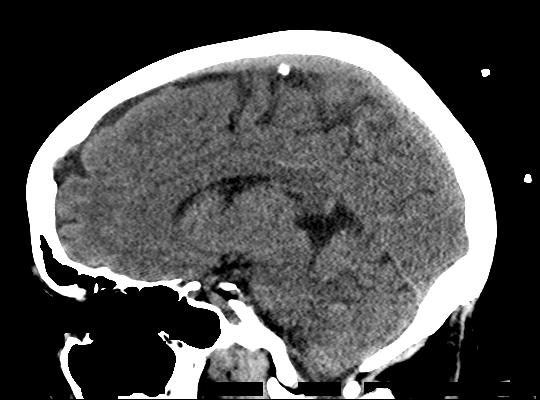
[im 48/72  brain]
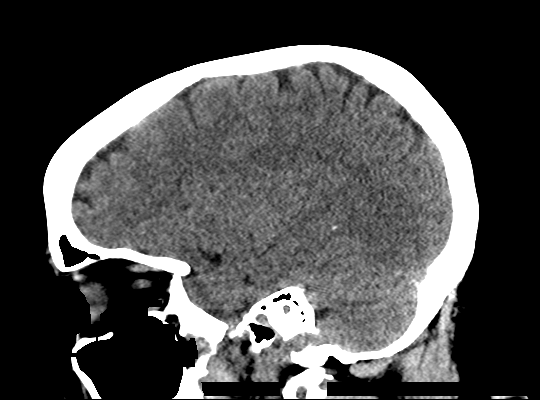

[18 of 47 positions shown; findings below may reference images not displayed]

FINDINGS: Brain: No evidence of acute infarction, hemorrhage, hydrocephalus,
extra-axial collection or mass lesion/mass effect.

Vascular: No hyperdense vessel or unexpected calcification.

Skull: Normal. Negative for fracture or focal lesion.

Sinuses/Orbits: No acute finding.

Other: None.
IMPRESSION: 1. No acute intracranial abnormalities.  Normal brain.

## 2019-03-30 ENCOUNTER — Ambulatory Visit: Payer: HRSA Program | Attending: Internal Medicine

## 2019-03-30 DIAGNOSIS — Z20822 Contact with and (suspected) exposure to covid-19: Secondary | ICD-10-CM

## 2019-03-30 DIAGNOSIS — Z20828 Contact with and (suspected) exposure to other viral communicable diseases: Secondary | ICD-10-CM | POA: Insufficient documentation

## 2019-03-31 LAB — NOVEL CORONAVIRUS, NAA: SARS-CoV-2, NAA: NOT DETECTED

## 2019-04-07 ENCOUNTER — Other Ambulatory Visit: Payer: Self-pay

## 2019-12-07 ENCOUNTER — Other Ambulatory Visit (HOSPITAL_COMMUNITY)
Admission: RE | Admit: 2019-12-07 | Discharge: 2019-12-07 | Disposition: A | Discharge status: Home / Self Care | Payer: 59 | Source: Ambulatory Visit | Attending: Obstetrics and Gynecology | Admitting: Obstetrics and Gynecology

## 2019-12-07 ENCOUNTER — Encounter (HOSPITAL_COMMUNITY)
Admission: RE | Admit: 2019-12-07 | Discharge: 2019-12-07 | Disposition: A | Discharge status: Home / Self Care | Payer: 59 | Source: Ambulatory Visit | Attending: Obstetrics and Gynecology | Admitting: Obstetrics and Gynecology

## 2019-12-07 ENCOUNTER — Encounter (HOSPITAL_BASED_OUTPATIENT_CLINIC_OR_DEPARTMENT_OTHER): Payer: Self-pay | Admitting: Obstetrics and Gynecology

## 2019-12-07 ENCOUNTER — Other Ambulatory Visit: Payer: Self-pay

## 2019-12-07 DIAGNOSIS — Z01812 Encounter for preprocedural laboratory examination: Secondary | ICD-10-CM | POA: Insufficient documentation

## 2019-12-07 DIAGNOSIS — Z20822 Contact with and (suspected) exposure to covid-19: Secondary | ICD-10-CM | POA: Insufficient documentation

## 2019-12-07 LAB — BASIC METABOLIC PANEL
Anion gap: 9 (ref 5–15)
BUN: 16 mg/dL (ref 6–20)
CO2: 23 mmol/L (ref 22–32)
Calcium: 8.7 mg/dL — ABNORMAL LOW (ref 8.9–10.3)
Chloride: 105 mmol/L (ref 98–111)
Creatinine, Ser: 0.74 mg/dL (ref 0.44–1.00)
GFR calc Af Amer: >60 mL/min (ref >60)
GFR calc non Af Amer: >60 mL/min (ref >60)
Glucose, Bld: 74 mg/dL (ref 70–99)
Potassium: 5 mmol/L (ref 3.5–5.1)
Sodium: 137 mmol/L (ref 135–145)

## 2019-12-07 LAB — CBC
HCT: 39.4 % (ref 36.0–46.0)
Hemoglobin: 12.2 g/dL (ref 12.0–15.0)
MCH: 27.4 pg (ref 26.0–34.0)
MCHC: 31 g/dL (ref 30.0–36.0)
MCV: 88.5 fL (ref 80.0–100.0)
Platelets: 191 10*3/uL (ref 150–400)
RBC: 4.45 MIL/uL (ref 3.87–5.11)
RDW: 14 % (ref 11.5–15.5)
WBC: 5.3 10*3/uL (ref 4.0–10.5)
nRBC: 0 % (ref 0.0–0.2)

## 2019-12-07 LAB — SARS CORONAVIRUS 2 (TAT 6-24 HRS): SARS Coronavirus 2: NEGATIVE

## 2019-12-07 NOTE — Progress Notes (Signed)
Spoke w/ via phone for pre-op interview--- PT Lab needs dos----  Urine preg             Lab results------ pt had CBC, T&S, BMP(gent ordered) today results in epic COVID test ------ 12-07-2019 @1000  Arrive at ------- 0530 NPO after MN  Medications to take morning of surgery ----- NONE Diabetic medication ----- n/a Patient Special Instructions ----- n/a Pre-Op special Istructions ----- n/a Patient verbalized understanding of instructions that were given at this phone interview. Patient denies shortness of breath, chest pain, fever, cough at this phone interview.

## 2019-12-09 NOTE — H&P (Signed)
Mikayla Humphrey is an 39 y.o. female. She has a 4 cm left ovarian cyst with diffuse echoes suspicious for endometrioma. She has some LLQ pain and pressure.Right ovary has a 2 cm simple follicle. Laparoscopy in 2011 noted probable endometriosis. IUD is in proper location in uterus.  Pertinent Gynecological History: Menses: flow is light Bleeding:  Contraception: IUD DES exposure: denies Blood transfusions: none Sexually transmitted diseases: no past history Previous GYN Procedures: laparoscopy  Last mammogram: N/A Date:  Last pap: normal Date: 2021 OB History: G4, P3   Menstrual History: Menarche age: unknown Patient's last menstrual period was 11/03/2019 (approximate).    Past Medical History:  Diagnosis Date  . Anxiety   . Endometriosis 06/08/2010  . History of anal fissures   . History of anemia   . History of herpes genitalis   . Hyperlipidemia    diet controlled, no meds  . Left ovarian cyst   . Pelvic pain   . Wears glasses     Past Surgical History:  Procedure Laterality Date  . CESAREAN SECTION  2001   @[facility]  . CESAREAN SECTION N/A 03/19/2014   Procedure: CESAREAN SECTION;  Surgeon: Allena Katz, MD;  Location: [facility];  Service: Obstetrics;  Laterality: N/A;  repeat  edc 03/26/14  . CESAREAN SECTION N/A 10/21/2015   Procedure: REPEAT CESAREAN SECTION;  Surgeon: Everlene Farrier, MD;  Location: [facility];  Service: Obstetrics;  Laterality: N/A;  Requested RNFA  Tracey T  . DIAGNOSTIC LAPAROSCOPY  02-23-2010   @[facility]    W/  LYSIS ADHESIONS AND BX'S AND C/S SCAR INCISION REVISION  . WISDOM TOOTH EXTRACTION      Family History  Problem Relation Age of Onset  . Bipolar disorder Sister   . Hypertension Maternal Grandmother   . Stroke Paternal Grandmother     Social History:  reports that she has never smoked. She has never used smokeless tobacco. She reports that she does not drink alcohol and does not use drugs.  Allergies:  Allergies  Allergen  Reactions  . Amoxicillin Hives and Rash    Has patient had a PCN reaction causing immediate rash, facial/tongue/throat swelling, SOB or lightheadedness with hypotension: No Has patient had a PCN reaction causing severe rash involving mucus membranes or skin necrosis: No Has patient had a PCN reaction that required hospitalization No Has patient had a PCN reaction occurring within the last 10 years: No If all of the above answers are "NO", then may proceed with Cephalosporin use.     No medications prior to admission.    Review of Systems  Constitutional: Negative for fever.    Height 5\' 7"  (1.702 m), weight 100.2 kg, last menstrual period 11/03/2019, unknown if currently breastfeeding. Physical Exam Cardiovascular:     Rate and Rhythm: Normal rate.  Pulmonary:     Effort: Pulmonary effort is normal.     No results found for this or any previous visit (from the past 24 hour(s)).  No results found.  Assessment/Plan: 39 yo G4P3 with left ovarian cyst suspicious for endometrioma Laparoscopy with possible left ovarian cystectomy, possible left salpingo oophorectomy has been discussed. Risks reviewed including infection, organ damage, bleeding/transfusion-HIV/Hep, DVT/PE, pneumonia, laparotomy.   Shon Millet II 12/09/2019, 5:26 PM

## 2019-12-10 ENCOUNTER — Encounter (HOSPITAL_BASED_OUTPATIENT_CLINIC_OR_DEPARTMENT_OTHER): Payer: Self-pay | Admitting: Obstetrics and Gynecology

## 2019-12-10 ENCOUNTER — Ambulatory Visit (HOSPITAL_BASED_OUTPATIENT_CLINIC_OR_DEPARTMENT_OTHER)
Admission: RE | Admit: 2019-12-10 | Discharge: 2019-12-10 | Disposition: A | Discharge status: Home / Self Care | Payer: 59 | Source: Other Acute Inpatient Hospital | Attending: Obstetrics and Gynecology | Admitting: Obstetrics and Gynecology

## 2019-12-10 ENCOUNTER — Encounter (HOSPITAL_BASED_OUTPATIENT_CLINIC_OR_DEPARTMENT_OTHER)
Admission: RE | Disposition: A | Discharge status: Home / Self Care | Payer: Self-pay | Source: Other Acute Inpatient Hospital | Attending: Obstetrics and Gynecology

## 2019-12-10 ENCOUNTER — Ambulatory Visit (HOSPITAL_BASED_OUTPATIENT_CLINIC_OR_DEPARTMENT_OTHER): Payer: 59 | Admitting: Anesthesiology

## 2019-12-10 DIAGNOSIS — Z6835 Body mass index (BMI) 35.0-35.9, adult: Secondary | ICD-10-CM | POA: Diagnosis not present

## 2019-12-10 DIAGNOSIS — D271 Benign neoplasm of left ovary: Secondary | ICD-10-CM | POA: Insufficient documentation

## 2019-12-10 DIAGNOSIS — E669 Obesity, unspecified: Secondary | ICD-10-CM | POA: Insufficient documentation

## 2019-12-10 DIAGNOSIS — R102 Pelvic and perineal pain: Secondary | ICD-10-CM | POA: Diagnosis present

## 2019-12-10 DIAGNOSIS — N83202 Unspecified ovarian cyst, left side: Secondary | ICD-10-CM

## 2019-12-10 HISTORY — DX: Anxiety disorder, unspecified: F41.9

## 2019-12-10 HISTORY — PX: LAPAROSCOPIC OVARIAN CYSTECTOMY: SHX6248

## 2019-12-10 HISTORY — DX: Presence of spectacles and contact lenses: Z97.3

## 2019-12-10 HISTORY — DX: Personal history of other infectious and parasitic diseases: Z86.19

## 2019-12-10 HISTORY — DX: Pelvic and perineal pain: R10.2

## 2019-12-10 HISTORY — DX: Personal history of diseases of the blood and blood-forming organs and certain disorders involving the immune mechanism: Z86.2

## 2019-12-10 HISTORY — DX: Unspecified ovarian cyst, left side: N83.202

## 2019-12-10 LAB — TYPE AND SCREEN
ABO/RH(D): A POS
Antibody Screen: NEGATIVE

## 2019-12-10 LAB — POCT PREGNANCY, URINE: Preg Test, Ur: NEGATIVE

## 2019-12-10 SURGERY — EXCISION, CYST, OVARY, LAPAROSCOPIC
Anesthesia: General | Site: Abdomen | Laterality: Left

## 2019-12-10 MED ORDER — KETOROLAC TROMETHAMINE 30 MG/ML IJ SOLN
30.0000 mg | Freq: Once | INTRAMUSCULAR | Status: AC
  Administered 2019-12-10: 30 mg via INTRAVENOUS

## 2019-12-10 MED ORDER — ONDANSETRON HCL 4 MG/2ML IJ SOLN
INTRAMUSCULAR | Status: DC | PRN
  Administered 2019-12-10: 4 mg via INTRAVENOUS

## 2019-12-10 MED ORDER — SUGAMMADEX SODIUM 200 MG/2ML IV SOLN
INTRAVENOUS | Status: DC | PRN
  Administered 2019-12-10: 300 mg via INTRAVENOUS

## 2019-12-10 MED ORDER — FENTANYL CITRATE (PF) 250 MCG/5ML IJ SOLN
INTRAMUSCULAR | Status: AC
  Filled 2019-12-10: qty 5

## 2019-12-10 MED ORDER — FENTANYL CITRATE (PF) 100 MCG/2ML IJ SOLN
INTRAMUSCULAR | Status: DC | PRN
Start: 2019-12-10 — End: 2019-12-10
  Administered 2019-12-10: 50 ug via INTRAVENOUS
  Administered 2019-12-10: 25 ug via INTRAVENOUS
  Administered 2019-12-10: 50 ug via INTRAVENOUS
  Administered 2019-12-10: 75 ug via INTRAVENOUS
  Administered 2019-12-10: 50 ug via INTRAVENOUS

## 2019-12-10 MED ORDER — ACETAMINOPHEN 500 MG PO TABS
1000.0000 mg | ORAL_TABLET | Freq: Once | ORAL | Status: AC
  Administered 2019-12-10: 1000 mg via ORAL

## 2019-12-10 MED ORDER — ACETAMINOPHEN 500 MG PO TABS
ORAL_TABLET | ORAL | Status: AC
  Filled 2019-12-10: qty 2

## 2019-12-10 MED ORDER — PROPOFOL 10 MG/ML IV BOLUS
INTRAVENOUS | Status: DC | PRN
  Administered 2019-12-10: 160 mg via INTRAVENOUS

## 2019-12-10 MED ORDER — GENTAMICIN SULFATE 40 MG/ML IJ SOLN
5.0000 mg/kg | INTRAMUSCULAR | Status: AC
  Administered 2019-12-10: 30030 mg via INTRAVENOUS
  Filled 2019-12-10: qty 9.75

## 2019-12-10 MED ORDER — ONDANSETRON HCL 4 MG/2ML IJ SOLN
INTRAMUSCULAR | Status: AC
  Filled 2019-12-10: qty 2

## 2019-12-10 MED ORDER — OXYCODONE HCL 5 MG PO TABS
ORAL_TABLET | ORAL | Status: AC
  Filled 2019-12-10: qty 1

## 2019-12-10 MED ORDER — FENTANYL CITRATE (PF) 100 MCG/2ML IJ SOLN: 25.0000 ug | INTRAMUSCULAR | Status: DC | PRN

## 2019-12-10 MED ORDER — BUPIVACAINE HCL (PF) 0.5 % IJ SOLN
INTRAMUSCULAR | Status: DC | PRN
  Administered 2019-12-10: 10 mL

## 2019-12-10 MED ORDER — OXYCODONE HCL 5 MG PO TABS: 5.0000 mg | ORAL_TABLET | Freq: Once | ORAL | Status: DC | PRN

## 2019-12-10 MED ORDER — KETOROLAC TROMETHAMINE 30 MG/ML IJ SOLN
INTRAMUSCULAR | Status: AC
  Filled 2019-12-10: qty 1

## 2019-12-10 MED ORDER — 0.9 % SODIUM CHLORIDE (POUR BTL) OPTIME
TOPICAL | Status: DC | PRN
  Administered 2019-12-10: 1000 mL

## 2019-12-10 MED ORDER — LACTATED RINGERS IV SOLN: INTRAVENOUS | Status: DC

## 2019-12-10 MED ORDER — MIDAZOLAM HCL 5 MG/5ML IJ SOLN
INTRAMUSCULAR | Status: DC | PRN
  Administered 2019-12-10: 2 mg via INTRAVENOUS

## 2019-12-10 MED ORDER — LIDOCAINE HCL (CARDIAC) PF 100 MG/5ML IV SOSY
PREFILLED_SYRINGE | INTRAVENOUS | Status: DC | PRN
  Administered 2019-12-10: 60 mg via INTRAVENOUS

## 2019-12-10 MED ORDER — OXYCODONE HCL 5 MG/5ML PO SOLN: 5.0000 mg | Freq: Once | ORAL | Status: DC | PRN

## 2019-12-10 MED ORDER — PROPOFOL 10 MG/ML IV BOLUS
INTRAVENOUS | Status: AC
  Filled 2019-12-10: qty 20

## 2019-12-10 MED ORDER — DEXAMETHASONE SODIUM PHOSPHATE 10 MG/ML IJ SOLN
INTRAMUSCULAR | Status: DC | PRN
  Administered 2019-12-10: 4 mg via INTRAVENOUS

## 2019-12-10 MED ORDER — CLINDAMYCIN PHOSPHATE 900 MG/50ML IV SOLN
INTRAVENOUS | Status: AC
  Filled 2019-12-10: qty 50

## 2019-12-10 MED ORDER — MIDAZOLAM HCL 2 MG/2ML IJ SOLN
INTRAMUSCULAR | Status: AC
  Filled 2019-12-10: qty 2

## 2019-12-10 MED ORDER — CLINDAMYCIN PHOSPHATE 900 MG/50ML IV SOLN
900.0000 mg | INTRAVENOUS | Status: AC
  Administered 2019-12-10: 900 mg via INTRAVENOUS

## 2019-12-10 MED ORDER — ROCURONIUM BROMIDE 100 MG/10ML IV SOLN
INTRAVENOUS | Status: DC | PRN
  Administered 2019-12-10: 20 mg via INTRAVENOUS
  Administered 2019-12-10: 70 mg via INTRAVENOUS

## 2019-12-10 SURGICAL SUPPLY — 57 items
ADH SKN CLS APL DERMABOND .7 (GAUZE/BANDAGES/DRESSINGS) ×1
APL SKNCLS STERI-STRIP NONHPOA (GAUZE/BANDAGES/DRESSINGS)
BAG RETRIEVAL 10 (BASKET)
BAG SPEC RTRVL LRG 6X4 10 (ENDOMECHANICALS) ×1
BARRIER ADHS 3X4 INTERCEED (GAUZE/BANDAGES/DRESSINGS) IMPLANT
BENZOIN TINCTURE PRP APPL 2/3 (GAUZE/BANDAGES/DRESSINGS) IMPLANT
BLADE CLIPPER SENSICLIP SURGIC (BLADE) IMPLANT
BRR ADH 4X3 ABS CNTRL BYND (GAUZE/BANDAGES/DRESSINGS)
CABLE HIGH FREQUENCY MONO STRZ (ELECTRODE) IMPLANT
CANISTER SUCT 3000ML PPV (MISCELLANEOUS) ×2 IMPLANT
CATH ROBINSON RED A/P 16FR (CATHETERS) IMPLANT
CLOTH BEACON ORANGE TIMEOUT ST (SAFETY) ×2 IMPLANT
COVER MAYO STAND STRL (DRAPES) ×2 IMPLANT
DECANTER SPIKE VIAL GLASS SM (MISCELLANEOUS) IMPLANT
DERMABOND ADVANCED (GAUZE/BANDAGES/DRESSINGS) ×1
DERMABOND ADVANCED .7 DNX12 (GAUZE/BANDAGES/DRESSINGS) ×1 IMPLANT
DRSG OPSITE POSTOP 3X4 (GAUZE/BANDAGES/DRESSINGS) ×2 IMPLANT
DRSG TELFA 3X8 NADH (GAUZE/BANDAGES/DRESSINGS) IMPLANT
DURAPREP 26ML APPLICATOR (WOUND CARE) ×2 IMPLANT
ELECT REM PT RETURN 9FT ADLT (ELECTROSURGICAL)
ELECTRODE REM PT RTRN 9FT ADLT (ELECTROSURGICAL) IMPLANT
GLOVE BIO SURGEON STRL SZ8 (GLOVE) ×4 IMPLANT
GOWN STRL REUS W/TWL LRG LVL3 (GOWN DISPOSABLE) ×6 IMPLANT
HOLDER FOLEY CATH W/STRAP (MISCELLANEOUS) IMPLANT
KIT TURNOVER CYSTO (KITS) ×2 IMPLANT
NEEDLE HYPO 25X1 1.5 SAFETY (NEEDLE) ×2 IMPLANT
NEEDLE INSUFFLATION 120MM (ENDOMECHANICALS) ×2 IMPLANT
NEEDLE INSUFFLATION 14GA 150MM (NEEDLE) IMPLANT
NS IRRIG 500ML POUR BTL (IV SOLUTION) ×2 IMPLANT
PACK LAPAROSCOPY BASIN (CUSTOM PROCEDURE TRAY) ×2 IMPLANT
PACK TRENDGUARD 450 HYBRID PRO (MISCELLANEOUS) ×1 IMPLANT
PAD OB MATERNITY 4.3X12.25 (PERSONAL CARE ITEMS) ×2 IMPLANT
PAD PREP 24X48 CUFFED NSTRL (MISCELLANEOUS) ×2 IMPLANT
POUCH SPECIMEN RETRIEVAL 10MM (ENDOMECHANICALS) ×2 IMPLANT
SCISSORS LAP 5X35 DISP (ENDOMECHANICALS) IMPLANT
SEALER TISSUE G2 CVD JAW 45CM (ENDOMECHANICALS) ×2 IMPLANT
SET SUCTION IRRIG HYDROSURG (IRRIGATION / IRRIGATOR) IMPLANT
SET TUBE SMOKE EVAC HIGH FLOW (TUBING) ×2 IMPLANT
SOLUTION ELECTROLUBE (MISCELLANEOUS) IMPLANT
STRIP CLOSURE SKIN 1/4X4 (GAUZE/BANDAGES/DRESSINGS) IMPLANT
SUT VIC AB 3-0 PS2 18 (SUTURE)
SUT VIC AB 3-0 PS2 18XBRD (SUTURE) IMPLANT
SUT VIC AB 4-0 PS2 27 (SUTURE) ×2 IMPLANT
SUT VICRYL 0 UR6 27IN ABS (SUTURE) ×4 IMPLANT
SYR 30ML LL (SYRINGE) ×2 IMPLANT
SYR 3ML 23GX1 SAFETY (SYRINGE) IMPLANT
SYS BAG RETRIEVAL 10MM (BASKET)
SYSTEM BAG RETRIEVAL 10MM (BASKET) IMPLANT
TOWEL OR 17X26 10 PK STRL BLUE (TOWEL DISPOSABLE) ×4 IMPLANT
TRAY FOLEY W/BAG SLVR 14FR LF (SET/KITS/TRAYS/PACK) IMPLANT
TRENDGUARD 450 HYBRID PRO PACK (MISCELLANEOUS) ×2
TROCAR BALLN 12MMX100 BLUNT (TROCAR) ×2 IMPLANT
TROCAR BLADELESS OPT 5 100 (ENDOMECHANICALS) ×4 IMPLANT
TROCAR XCEL NON-BLD 11X100MML (ENDOMECHANICALS) ×2 IMPLANT
TUBE CONNECTING 12X1/4 (SUCTIONS) IMPLANT
WARMER LAPAROSCOPE (MISCELLANEOUS) ×2 IMPLANT
WATER STERILE IRR 500ML POUR (IV SOLUTION) ×2 IMPLANT

## 2019-12-10 NOTE — Op Note (Signed)
NAME: Mikayla Humphrey, Mikayla Humphrey MEDICAL RECORD [id number] ACCOUNT [id number] DATE OF BIRTH:1980-12-07 FACILITY: [facility] LOCATION: [facility] PHYSICIAN:Prem Coykendall E. Nikol Lemar II, MD  OPERATIVE REPORT  DATE OF PROCEDURE:  12/10/2019  PREOPERATIVE DIAGNOSES: 1.  Left ovarian cyst. 2.  Pelvic pain.  POSTOPERATIVE DIAGNOSES: 1.  Left ovarian cyst. 2.  Pelvic pain.  PROCEDURE:  Laparoscopic left salpingo-oophorectomy.  SURGEON:  Everlene Farrier, MD   ANESTHESIA:  General with endotracheal intubation.  ESTIMATED BLOOD LOSS:  10 mL.  SPECIMENS:  Left tube and ovary to pathology.  INDICATIONS AND CONSENT:  This patient is a 39 year old patient with a growing and persistent left ovarian cyst with diffuse internal echoes.  Details are dictated in the history and physical.  Laparoscopy with possible left ovarian cystectomy, possible  left salpingo-oophorectomy has been discussed preoperatively.  Potential risks and complications were discussed with the patient preoperatively, including but not limited to infection, organ damage, bleeding requiring transfusion of blood products with  HIV and hepatitis acquisition, DVT, PE, pneumonia, laparotomy.  The patient states she understands and agrees.  Her left lower quadrant is marked to signify the left ovary in the preoperative area.  Consent was signed on the chart.  FINDINGS:  There were omental adhesions immediately to the left of the umbilicus at the level of the umbilicus.  In the pelvis, the uterus was smooth in contour.  Right tube and ovary was normal.  The left ovary was smooth without adhesions, but  obviously enlarged to about 4-5 cm.  DESCRIPTION OF PROCEDURE:  The patient was taken to the operating room where she was identified, placed in the dorsal supine position and general anesthesia was induced via endotracheal intubation.  She was placed in the dorsal lithotomy position.  She  was prepped vaginally with Betadine, bladder straight catheterized and  prepped with ChloraPrep.  Timeout was undertaken.  A single-tooth tenaculum was used to grasp the posterior cervical lip and an acorn tenaculum was placed in the endocervix.  After a  3-minute drying time, she was draped in a sterile fashion.  The infraumbilical and suprapubic area injected with a total of about 10 mL of 0.5% plain Marcaine.  The old infraumbilical scar was noted and the ends were held up with Allis clamps and a  scalpel was used to incise the skin in the same location.  Dissection was carried down progressively to the peritoneal cavity which was entered without difficulty.  Anchoring sutures of 0 Vicryl were placed at 3 and 9 o'clock under good visualization and  held.  The disposable Hasson trocar sleeve was placed.  The balloon was inflated and it was tied down with the anchoring sutures.  The operative scope was then placed and under careful visualization, low flow was used to assure proper position and the  abdomen.  High flow was then used.  A small suprapubic incision was made in the midline and a 5 mm trocar sleeve was placed under direct visualization.  About 4 cm to the right of this at the same level, a second small incision was made and a second 5 mm  trocar sleeve was placed under direct visualization.  These were used to elevate the uterine fundus by grasping the left round ligament, which allowed the left tube and ovary to be mobilized.  After assuring that the infundibulopelvic ligament and ovary  and tube were well clear of the pelvic sidewall and well clear of the course of the ureter, the EnSeal bipolar cautery cutting instrument was used to take down  the infundibulopelvic ligament, come across the lower part of the ovary and then across the  proximal tube and uteroovarian ligament.  Good hemostasis was maintained.  Specimen was placed in the anterior cul-de-sac.  Then, using the 5 mm laparoscope through the midline suprapubic trocar sleeve, the EndoCatch was used through  the umbilical trocar  to retrieve the ovary through the subumbilical incision without difficulty.  The Hasson trocar sleeve was then replaced under direct visualization from the 5 mm scope and the balloon was reinflated.  It was anchored down and the operative scope was  again used.  Careful inspection revealed continued hemostasis all around.  Pneumoperitoneum was reduced.  All the umbilical trocar sleeves were removed.  The fascia in the umbilical incision was closed under direct visualization using the anchoring  sutures to close the fascia circumferentially.  This offered good closure.  4-0 Vicryl was then used to close the skin with interrupted suture at the umbilical incision and Dermabond was placed inferior 2 incisions.  All counts were correct.  The patient  was awakened and taken to the recovery room in stable condition.  VN/NUANCE  D:12/10/2019 T:12/10/2019 JOB:[id number]

## 2019-12-10 NOTE — Transfer of Care (Signed)
Immediate Anesthesia Transfer of Care Note  Patient: Mikayla Humphrey  Procedure(s) Performed: DIAGNOSTIC LAPAROSCOPY, POSSIBLE LAPAROSCOPIC OVARIAN CYSTECTOMY, POSSIBLE OOPHORECTOMY (Left Abdomen)  Patient Location: PACU  Anesthesia Type:General  Level of Consciousness: awake, alert  and oriented  Airway & Oxygen Therapy: Patient Spontanous Breathing and Patient connected to face mask oxygen  Post-op Assessment: Report given to RN and Post -op Vital signs reviewed and stable  Post vital signs: Reviewed and stable  Last Vitals:  Vitals Value Taken Time  BP 119/60 12/10/19 0919  Temp    Pulse 58 12/10/19 0919  Resp 14 12/10/19 0920  SpO2 100 % 12/10/19 0919  Vitals shown include unvalidated device data.  Last Pain:  Vitals:   12/10/19 0530  TempSrc: Oral  PainSc: 0-No pain      Patients Stated Pain Goal: 5 (59/29/24 4628)  Complications: No complications documented.

## 2019-12-10 NOTE — Anesthesia Postprocedure Evaluation (Signed)
Anesthesia Post Note  Patient: Rhyse Skowron  Procedure(s) Performed: DIAGNOSTIC LAPAROSCOPY, POSSIBLE LAPAROSCOPIC OVARIAN CYSTECTOMY, POSSIBLE OOPHORECTOMY (Left Abdomen)     Patient location during evaluation: PACU Anesthesia Type: General Level of consciousness: awake and alert Pain management: pain level controlled Vital Signs Assessment: post-procedure vital signs reviewed and stable Respiratory status: spontaneous breathing, nonlabored ventilation, respiratory function stable and patient connected to nasal cannula oxygen Cardiovascular status: blood pressure returned to baseline and stable Postop Assessment: no apparent nausea or vomiting Anesthetic complications: no   No complications documented.  Last Vitals:  Vitals:   12/10/19 1015 12/10/19 1030  BP: 108/64 111/71  Pulse: 64 (!) 58  Resp: 10 11  Temp:    SpO2: 98% 97%    Last Pain:  Vitals:   12/10/19 1030  TempSrc:   PainSc: 3                  Armella Stogner L Jaeley Wiker

## 2019-12-10 NOTE — Progress Notes (Signed)
12/10/2019  9:09 AM  PATIENT:  Mikayla Humphrey  39 y.o. female  PRE-OPERATIVE DIAGNOSIS:  left ovarian cyst, pelvic pain  POST-OPERATIVE DIAGNOSIS:  left ovarian cyst, pelvic pain  PROCEDURE:  Laparoscopic left salpingo oophorectomy  SURGEON:  Surgeon(s) and Role:    Everlene Farrier, MD - Primary  PHYSICIAN ASSISTANT:   ASSISTANTS: none   ANESTHESIA:   general  EBL:  10 mL   BLOOD ADMINISTERED:none  DRAINS: none   LOCAL MEDICATIONS USED:  MARCAINE    and Amount: 10 ml  SPECIMEN:  Source of Specimen:  left tube and ovary  DISPOSITION OF SPECIMEN:  PATHOLOGY  COUNTS:  YES  TOURNIQUET:  * No tourniquets in log *  DICTATION: .Other Dictation: Dictation Number [id number]  PLAN OF CARE: Discharge to home after PACU  PATIENT DISPOSITION:  PACU - hemodynamically stable.   Delay start of Pharmacological VTE agent (>24hrs) due to surgical blood loss or risk of bleeding: not applicable

## 2019-12-10 NOTE — Progress Notes (Signed)
No change to H&P per patient history Reviewed procedure-laparoscopy, possible left ovarian cystectomy, possible left salpingo oophorectomy. LLQ marked in pre op All questions answered Patient states she understands and agrees.

## 2019-12-10 NOTE — Discharge Instructions (Signed)
Post Anesthesia Home Care Instructions  Activity: Get plenty of rest for the remainder of the day. A responsible adult should stay with you for 24 hours following the procedure.  For the next 24 hours, DO NOT: -Drive a car -Operate machinery -Drink alcoholic beverages -Take any medication unless instructed by your physician -Make any legal decisions or sign important papers.  Meals: Start with liquid foods such as gelatin or soup. Progress to regular foods as tolerated. Avoid greasy, spicy, heavy foods. If nausea and/or vomiting occur, drink only clear liquids until the nausea and/or vomiting subsides. Call your physician if vomiting continues.  Special Instructions/Symptoms: Your throat may feel dry or sore from the anesthesia or the breathing tube placed in your throat during surgery. If this causes discomfort, gargle with warm salt water. The discomfort should disappear within 24 hours.  If you had a scopolamine patch placed behind your ear for the management of post- operative nausea and/or vomiting:  1. The medication in the patch is effective for 72 hours, after which it should be removed.  Wrap patch in a tissue and discard in the trash. Wash hands thoroughly with soap and water. 2. You may remove the patch earlier than 72 hours if you experience unpleasant side effects which may include dry mouth, dizziness or visual disturbances. 3. Avoid touching the patch. Wash your hands with soap and water after contact with the patch.   DISCHARGE INSTRUCTIONS: Laparoscopy  The following instructions have been prepared to help you care for yourself upon your return home today.  Wound care: . Do not get the incision wet for the first 24 hours. The incision should be kept clean and dry. . The Band-Aids or dressings may be removed the day after surgery. . Should the incision become sore, red, and swollen after the first week, check with your doctor.  Personal hygiene: . Shower the day  after your procedure.  Activity and limitations: . Do NOT drive or operate any equipment today. . Do NOT lift anything more than 15 pounds for 2-3 weeks after surgery. . Do NOT rest in bed all day. . Walking is encouraged. Walk each day, starting slowly with 5-minute walks 3 or 4 times a day. Slowly increase the length of your walks. . Walk up and down stairs slowly. . Do NOT do strenuous activities, such as golfing, playing tennis, bowling, running, biking, weight lifting, gardening, mowing, or vacuuming for 2-4 weeks. Ask your doctor when it is okay to start.  Diet: Eat a light meal as desired this evening. You may resume your usual diet tomorrow.  Return to work: This is dependent on the type of work you do. For the most part you can return to a desk job within a week of surgery. If you are more active at work, please discuss this with your doctor.  What to expect after your surgery: You may have a slight burning sensation when you urinate on the first day. You may have a very small amount of blood in the urine. Expect to have a small amount of vaginal discharge/light bleeding for 1-2 weeks. It is not unusual to have abdominal soreness and bruising for up to 2 weeks. You may be tired and need more rest for about 1 week. You may experience shoulder pain for 24-72 hours. Lying flat in bed may relieve it.  Call your doctor for any of the following: . Develop a fever of 100.4 or greater . Inability to urinate 6 hours after discharge   from hospital . Severe pain not relieved by pain medications . Persistent of heavy bleeding at incision site . Redness or swelling around incision site after a week . Increasing nausea or vomiting  Patient Signature________________________________________ Nurse Signature_________________________________________ 

## 2019-12-10 NOTE — Anesthesia Procedure Notes (Signed)

## 2019-12-10 NOTE — Anesthesia Preprocedure Evaluation (Signed)
Anesthesia Evaluation  Patient identified by MRN, date of birth, ID band Patient awake    Reviewed: Allergy & Precautions, NPO status , Patient's Chart, lab work & pertinent test results  Airway Mallampati: II  TM Distance: >3 FB Neck ROM: Full    Dental no notable dental hx.    Pulmonary neg pulmonary ROS,    Pulmonary exam normal breath sounds clear to auscultation       Cardiovascular Normal cardiovascular exam Rhythm:Regular Rate:Normal  HLD   Neuro/Psych PSYCHIATRIC DISORDERS Anxiety negative neurological ROS     GI/Hepatic negative GI ROS, Neg liver ROS,   Endo/Other  negative endocrine ROSObese BMI 35  Renal/GU negative Renal ROS  negative genitourinary   Musculoskeletal negative musculoskeletal ROS (+)   Abdominal   Peds  Hematology negative hematology ROS (+)   Anesthesia Other Findings   Reproductive/Obstetrics                             Anesthesia Physical Anesthesia Plan  ASA: II  Anesthesia Plan: General   Post-op Pain Management:    Induction: Intravenous  PONV Risk Score and Plan: 3 and Midazolam, Dexamethasone and Ondansetron  Airway Management Planned: Oral ETT  Additional Equipment:   Intra-op Plan:   Post-operative Plan: Extubation in OR  Informed Consent: I have reviewed the patients History and Physical, chart, labs and discussed the procedure including the risks, benefits and alternatives for the proposed anesthesia with the patient or authorized representative who has indicated his/her understanding and acceptance.     Dental advisory given  Plan Discussed with: CRNA  Anesthesia Plan Comments:         Anesthesia Quick Evaluation

## 2019-12-11 ENCOUNTER — Encounter (HOSPITAL_BASED_OUTPATIENT_CLINIC_OR_DEPARTMENT_OTHER): Payer: Self-pay | Admitting: Obstetrics and Gynecology

## 2019-12-11 LAB — SURGICAL PATHOLOGY

## 2020-02-19 ENCOUNTER — Other Ambulatory Visit: Payer: Self-pay | Admitting: Family Medicine

## 2020-02-19 DIAGNOSIS — R519 Headache, unspecified: Secondary | ICD-10-CM

## 2020-02-23 ENCOUNTER — Ambulatory Visit
Admission: RE | Admit: 2020-02-23 | Discharge: 2020-02-23 | Disposition: A | Discharge status: Home / Self Care | Payer: 59 | Source: Ambulatory Visit | Attending: Family Medicine | Admitting: Family Medicine

## 2020-02-23 DIAGNOSIS — R519 Headache, unspecified: Secondary | ICD-10-CM

## 2020-02-23 MED ORDER — GADOBENATE DIMEGLUMINE 529 MG/ML IV SOLN
20.0000 mL | Freq: Once | INTRAVENOUS | Status: AC | PRN
  Administered 2020-02-23: 20 mL via INTRAVENOUS

## 2020-04-07 ENCOUNTER — Other Ambulatory Visit: Payer: 59

## 2020-04-07 DIAGNOSIS — Z20822 Contact with and (suspected) exposure to covid-19: Secondary | ICD-10-CM

## 2020-04-08 LAB — NOVEL CORONAVIRUS, NAA: SARS-CoV-2, NAA: NOT DETECTED

## 2020-04-08 LAB — SARS-COV-2, NAA 2 DAY TAT

## 2020-05-11 ENCOUNTER — Other Ambulatory Visit: Payer: Self-pay | Admitting: *Deleted

## 2020-05-11 ENCOUNTER — Encounter: Payer: Self-pay | Admitting: *Deleted

## 2020-05-16 ENCOUNTER — Telehealth: Payer: Self-pay | Admitting: *Deleted

## 2020-05-16 ENCOUNTER — Ambulatory Visit: Payer: 59 | Admitting: Diagnostic Neuroimaging

## 2020-05-16 ENCOUNTER — Encounter: Payer: Self-pay | Admitting: Diagnostic Neuroimaging

## 2020-05-16 NOTE — Telephone Encounter (Signed)
Patient was no show for new patient appointment today. 

## 2020-05-24 ENCOUNTER — Encounter: Payer: Self-pay | Admitting: Nurse Practitioner

## 2020-06-02 ENCOUNTER — Other Ambulatory Visit: Payer: Self-pay

## 2020-06-02 DIAGNOSIS — E611 Iron deficiency: Secondary | ICD-10-CM | POA: Insufficient documentation

## 2020-06-02 DIAGNOSIS — G9332 Myalgic encephalomyelitis/chronic fatigue syndrome: Secondary | ICD-10-CM | POA: Insufficient documentation

## 2020-06-02 DIAGNOSIS — E785 Hyperlipidemia, unspecified: Secondary | ICD-10-CM | POA: Insufficient documentation

## 2020-06-02 DIAGNOSIS — R5382 Chronic fatigue, unspecified: Secondary | ICD-10-CM | POA: Insufficient documentation

## 2020-06-09 ENCOUNTER — Other Ambulatory Visit: Payer: 59

## 2020-06-09 ENCOUNTER — Ambulatory Visit (INDEPENDENT_AMBULATORY_CARE_PROVIDER_SITE_OTHER): Payer: 59 | Admitting: Nurse Practitioner

## 2020-06-09 ENCOUNTER — Encounter: Payer: Self-pay | Admitting: Nurse Practitioner

## 2020-06-09 VITALS — BP 106/60 | HR 76 | Ht 67.0 in | Wt 228.4 lb

## 2020-06-09 DIAGNOSIS — K5909 Other constipation: Secondary | ICD-10-CM

## 2020-06-09 DIAGNOSIS — R140 Abdominal distension (gaseous): Secondary | ICD-10-CM

## 2020-06-09 NOTE — Progress Notes (Addendum)
ASSESSMENT AND PLAN    # 40 yo female referred by GYN for bloating and lower abdominal discomfort.  --Celiac disease unlikely but will obtain tTg, IgA --She has mild constipation with decreased urge to have bowel movements but generally able to have one after relaxing on toilet for a while. Miralax caused loose stools.  Due to bloating I would avoid fiber supplements. She will try glycerin suppositories. Insert one qam, can repeat later in day if needed  --She drinks diet drinks and uses stevia which could be contributing to the bloating. Over the next couple weeks she will avoid both of these and see if there is any improvement in bloating.  If not then she will try IBgard, samples given --Follow-up with me in a few weeks  [place]     Primary Gastroenterologist : new Zenovia Jarred, MD   Chief Complaint : bloating and lower abdominal discomfort  Mikayla Humphrey is a 40 y.o. female with PMH significant for endometriosis, left ovarian cyst status post salpingo-oophorectomy September 2019, anxiety, mild chronic constipation, C-section x 3  Patient is referred by GYN Dr. Faye Ramsay for abdominal pain and bloating. Patient gives a 1 year history of intermittent bloating not necessarily postprandial.  No associated increase in flatus or belching . No associated nausea.  She has intentionally lost 40 pounds by following a low carbohydrate diet.  She does consume diet drinks and uses Stevia sweetener .   Patient recently began having lower abdominal discomfort similar to the pain she had prior to requiring removal of left ovary.  She became concerned and went to see her GYN who evaluated her and then recommended GI evaluation.  The lower abdominal discomfort is intermittent, it is not relieved with bowel movements.  Pain is not positional nor related to movement.  She has no urinary symptoms such as dysuria or hematuria.  She does endorse chronic constipation described as a  decreased urge to have a bowel movement.  Everyday she sits on the toilet and tries to relax to have a BM and is generally successful.  Her stools are not hard, denies straining.   She has no history of rectal bleeding.  Increasing water intake and drinking coffee is helpful.  She tried MiraLAX in the past but it made her stools too loose.    Past Medical History:  Diagnosis Date  . Anxiety   . Endometriosis 06/08/2010  . Generalized headaches   . History of anal fissures   . History of anemia   . History of herpes genitalis   . Hyperlipidemia    diet controlled, no meds  . Left ovarian cyst   . Panic attacks   . Wears glasses      Past Surgical History:  Procedure Laterality Date  . CESAREAN SECTION  2001   @[facility]  . CESAREAN SECTION N/A 03/19/2014   Procedure: CESAREAN SECTION;  Surgeon: Allena Katz, MD;  Location: [facility];  Service: Obstetrics;  Laterality: N/A;  repeat  edc 03/26/14  . CESAREAN SECTION N/A 10/21/2015   Procedure: REPEAT CESAREAN SECTION;  Surgeon: Everlene Farrier, MD;  Location: [facility];  Service: Obstetrics;  Laterality: N/A;  Requested RNFA  Tracey T  . DIAGNOSTIC LAPAROSCOPY  02-23-2010   @[facility]    W/  LYSIS ADHESIONS AND BX'S AND C/S SCAR INCISION REVISION  . LAPAROSCOPIC OVARIAN CYSTECTOMY Left 12/10/2019   Procedure: DIAGNOSTIC LAPAROSCOPY, POSSIBLE LAPAROSCOPIC OVARIAN CYSTECTOMY, POSSIBLE OOPHORECTOMY;  Surgeon: Everlene Farrier,  MD;  Location: [facility];  Service: Gynecology;  Laterality: Left;  . WISDOM TOOTH EXTRACTION     Family History  Problem Relation Age of Onset  . Bipolar disorder Sister   . Hypertension Maternal Grandmother   . Stroke Paternal Grandmother    Social History   Tobacco Use  . Smoking status: Never Smoker  . Smokeless tobacco: Never Used  Vaping Use  . Vaping Use: Never used  Substance Use Topics  . Alcohol use: No  . Drug use: Never   Current Outpatient Medications  Medication Sig Dispense  Refill  . ALPRAZolam (XANAX) 0.5 MG tablet Take 0.5 mg by mouth 3 (three) times daily as needed for anxiety.    Marland Kitchen levonorgestrel (MIRENA) 20 MCG/24HR IUD 1 each by Intrauterine route once.    . Multiple Vitamins-Minerals (ONE-A-DAY WOMENS PO) Take by mouth daily.     No current facility-administered medications for this visit.   Allergies  Allergen Reactions  . Amoxicillin Hives and Rash    Has patient had a PCN reaction causing immediate rash, facial/tongue/throat swelling, SOB or lightheadedness with hypotension: No Has patient had a PCN reaction causing severe rash involving mucus membranes or skin necrosis: No Has patient had a PCN reaction that required hospitalization No Has patient had a PCN reaction occurring within the last 10 years: No If all of the above answers are "NO", then may proceed with Cephalosporin use.      Review of Systems: All systems reviewed and negative except where noted in HPI.   PHYSICAL EXAM :    Wt Readings from Last 3 Encounters:  06/09/20 228 lb 6.4 oz (103.6 kg)  12/10/19 225 lb 14.4 oz (102.5 kg)  12/07/19 224 lb (101.6 kg)    BP 106/60   Pulse 76   Ht 5\' 7"  (1.702 m)   Wt 228 lb 6.4 oz (103.6 kg)   BMI 35.77 kg/m  Constitutional:  Pleasant female in no acute distress. Psychiatric: Normal mood and affect. Behavior is normal. EENT: Pupils normal.  Conjunctivae are normal. No scleral icterus. Neck supple.  Cardiovascular: Normal rate, regular rhythm. No edema Pulmonary/chest: Effort normal and breath sounds normal. No wheezing, rales or rhonchi. Abdominal: Soft, nondistended, nontender. Bowel sounds active throughout. There are no masses palpable. No hepatomegaly. Neurological: Alert and oriented to person place and time. Skin: Skin is warm and dry. No rashes noted.  Tye Savoy, NP  06/09/2020, 11:48 AM  Cc:  Referring Provider Shon Millet, MD

## 2020-06-09 NOTE — Patient Instructions (Addendum)
If you are age 41 or older, your body mass index should be between 23-30. Your Body mass index is 35.77 kg/m. If this is out of the aforementioned range listed, please consider follow up with your Primary Care Provider.  If you are age 41 or younger, your body mass index should be between 19-25. Your Body mass index is 35.77 kg/m. If this is out of the aformentioned range listed, please consider follow up with your Primary Care Provider.   LABS:  Lab work has been ordered for you today. Our lab is located in the basement. Press "B" on the elevator. The lab is located at the first door on the left as you exit the elevator.  HEALTHCARE LAWS AND MY CHART RESULTS: Due to recent changes in healthcare laws, you may see the results of your imaging and laboratory studies on MyChart before your provider has had a chance to review them.   We understand that in some cases there may be results that are confusing or concerning to you. Not all laboratory results come back in the same time frame and the provider may be waiting for multiple results in order to interpret others.  Please give [place] 48 hours in order for your provider to thoroughly review all the results before contacting the office for clarification of your results.    RECOMMENDATIONS:  Stop diet drinks and Stevia, if your symptoms do not improve after 2 weeks then start the IBgard.  Use glycerin suppositories daily.  We have scheduled you a 4 week follow up for 07/11/20 at 11:00am  It was great seeing you today! Thank you for entrusting me with your care and choosing [facility].  Tye Savoy, NP

## 2020-06-10 ENCOUNTER — Encounter: Payer: Self-pay | Admitting: Nurse Practitioner

## 2020-06-10 LAB — TISSUE TRANSGLUTAMINASE ABS,IGG,IGA
(tTG) Ab, IgA: <1 U/mL
(tTG) Ab, IgG: <1 U/mL

## 2020-06-10 LAB — IGA: Immunoglobulin A: 251 mg/dL (ref 47–310)

## 2020-06-13 NOTE — Progress Notes (Signed)
Addendum: Reviewed and agree with assessment and management plan. Chandler Swiderski M, MD  

## 2020-07-11 ENCOUNTER — Ambulatory Visit: Payer: 59 | Admitting: Nurse Practitioner

## 2020-11-11 ENCOUNTER — Encounter: Payer: Self-pay | Admitting: Podiatry

## 2020-11-11 ENCOUNTER — Ambulatory Visit: Payer: 59

## 2020-11-11 ENCOUNTER — Ambulatory Visit (INDEPENDENT_AMBULATORY_CARE_PROVIDER_SITE_OTHER): Payer: 59 | Admitting: Podiatry

## 2020-11-11 ENCOUNTER — Other Ambulatory Visit: Payer: Self-pay

## 2020-11-11 ENCOUNTER — Ambulatory Visit (INDEPENDENT_AMBULATORY_CARE_PROVIDER_SITE_OTHER): Payer: 59

## 2020-11-11 DIAGNOSIS — M722 Plantar fascial fibromatosis: Secondary | ICD-10-CM | POA: Diagnosis not present

## 2020-11-11 MED ORDER — TRIAMCINOLONE ACETONIDE 10 MG/ML IJ SUSP
10.0000 mg | Freq: Once | INTRAMUSCULAR | Status: AC
  Administered 2020-11-11: 10 mg

## 2020-11-11 MED ORDER — DICLOFENAC SODIUM 75 MG PO TBEC: 75.0000 mg | DELAYED_RELEASE_TABLET | Freq: Two times a day (BID) | ORAL | 2 refills | Status: DC

## 2020-11-11 NOTE — Patient Instructions (Signed)

## 2020-11-11 NOTE — Progress Notes (Signed)
Subjective:   Patient ID: Mikayla Humphrey, female   DOB: 40 y.o.   MRN: [id number]   HPI Patient states she has had a lot of heel pain right over left with it going on for around a month.  Does not remember specific injury but states it is quite sore worse when she gets up in the morning and after periods of sitting.  Patient states the left started just recently as she has been walking differently and does not smoke likes to be active   Review of Systems  All other systems reviewed and are negative.      Objective:  Physical Exam Vitals and nursing note reviewed.  Constitutional:      Appearance: She is well-developed.  Pulmonary:     Effort: Pulmonary effort is normal.  Musculoskeletal:        General: Normal range of motion.  Skin:    General: Skin is warm.  Neurological:     Mental Status: She is alert.    Neurovascular status intact muscle strength adequate range of motion adequate patient found to have exquisite discomfort plantar aspect right heel at the insertional point tendon calcaneus with inflammation fluid around the medial band and is noted to have moderate depression of the arch bilateral.  Patient has good digital perfusion well oriented     Assessment:  Acute Planter fasciitis right with inflammation fluid mild pain in the left     Plan:  H&P reviewed condition and x-ray and today went ahead did sterile prep injected the medial band right fascia 3 mg Kenalog 5 mg Xylocaine instructed on anti-inflammatories reappoint as needed  X-rays indicate moderate depression of the arch small spur in the posterior heel region plantar

## 2021-06-22 ENCOUNTER — Ambulatory Visit: Payer: Self-pay | Admitting: Podiatry

## 2021-11-13 ENCOUNTER — Ambulatory Visit: Payer: Self-pay | Admitting: Podiatry

## 2022-12-24 ENCOUNTER — Encounter (HOSPITAL_COMMUNITY): Payer: Self-pay

## 2022-12-24 ENCOUNTER — Emergency Department (HOSPITAL_COMMUNITY)
Admission: EM | Admit: 2022-12-24 | Discharge: 2022-12-25 | Disposition: A | Discharge status: Home / Self Care | Payer: PRIVATE HEALTH INSURANCE | Attending: Emergency Medicine | Admitting: Emergency Medicine

## 2022-12-24 DIAGNOSIS — Z1152 Encounter for screening for COVID-19: Secondary | ICD-10-CM | POA: Diagnosis not present

## 2022-12-24 DIAGNOSIS — R531 Weakness: Secondary | ICD-10-CM | POA: Diagnosis present

## 2022-12-24 DIAGNOSIS — H539 Unspecified visual disturbance: Secondary | ICD-10-CM | POA: Diagnosis not present

## 2022-12-24 NOTE — ED Notes (Signed)
Attempted blood draw in triage x4 with no success.

## 2022-12-24 NOTE — ED Triage Notes (Addendum)
Pt to ED with complaint of right sided weakness and blurred vision that began Friday. Seen at OSH ED and admitted and found to have optic nerve swelling on MRI. ECHO was recommended but no appointment until November. Reports symptoms have been worsening since onset. +lethargy

## 2022-12-24 NOTE — ED Notes (Signed)
Patient transported to Ultrasound 

## 2022-12-25 ENCOUNTER — Emergency Department (HOSPITAL_COMMUNITY): Payer: PRIVATE HEALTH INSURANCE

## 2022-12-25 ENCOUNTER — Other Ambulatory Visit: Payer: Self-pay

## 2022-12-25 LAB — CBC
HCT: 37.6 % (ref 36.0–46.0)
Hemoglobin: 11.6 g/dL — ABNORMAL LOW (ref 12.0–15.0)
MCH: 26.6 pg (ref 26.0–34.0)
MCHC: 30.9 g/dL (ref 30.0–36.0)
MCV: 86.2 fL (ref 80.0–100.0)
Platelets: 179 10*3/uL (ref 150–400)
RBC: 4.36 MIL/uL (ref 3.87–5.11)
RDW: 13.9 % (ref 11.5–15.5)
WBC: 5.6 10*3/uL (ref 4.0–10.5)
nRBC: 0 % (ref 0.0–0.2)

## 2022-12-25 LAB — URINALYSIS, ROUTINE W REFLEX MICROSCOPIC
Bilirubin Urine: NEGATIVE
Glucose, UA: NEGATIVE mg/dL
Hgb urine dipstick: NEGATIVE
Ketones, ur: NEGATIVE mg/dL
Leukocytes,Ua: NEGATIVE
Nitrite: NEGATIVE
Protein, ur: NEGATIVE mg/dL
Specific Gravity, Urine: 1.031 — ABNORMAL HIGH (ref 1.005–1.030)
pH: 5 (ref 5.0–8.0)

## 2022-12-25 LAB — COMPREHENSIVE METABOLIC PANEL
ALT: 17 U/L (ref 0–44)
AST: 26 U/L (ref 15–41)
Albumin: 3.1 g/dL — ABNORMAL LOW (ref 3.5–5.0)
Alkaline Phosphatase: 54 U/L (ref 38–126)
Anion gap: 6 (ref 5–15)
BUN: 11 mg/dL (ref 6–20)
CO2: 24 mmol/L (ref 22–32)
Calcium: 8 mg/dL — ABNORMAL LOW (ref 8.9–10.3)
Chloride: 105 mmol/L (ref 98–111)
Creatinine, Ser: 0.66 mg/dL (ref 0.44–1.00)
GFR, Estimated: >60 mL/min (ref >60)
Glucose, Bld: 82 mg/dL (ref 70–99)
Potassium: 3.5 mmol/L (ref 3.5–5.1)
Sodium: 135 mmol/L (ref 135–145)
Total Bilirubin: 0.7 mg/dL (ref 0.3–1.2)
Total Protein: 6.2 g/dL — ABNORMAL LOW (ref 6.5–8.1)

## 2022-12-25 LAB — SARS CORONAVIRUS 2 BY RT PCR: SARS Coronavirus 2 by RT PCR: NEGATIVE

## 2022-12-25 LAB — MAGNESIUM: Magnesium: 2.2 mg/dL (ref 1.7–2.4)

## 2022-12-25 LAB — HCG, SERUM, QUALITATIVE: Preg, Serum: NEGATIVE

## 2022-12-25 MED ORDER — LACTATED RINGERS IV BOLUS
1000.0000 mL | Freq: Once | INTRAVENOUS | Status: AC
  Administered 2022-12-25: 1000 mL via INTRAVENOUS

## 2022-12-25 MED ORDER — PROCHLORPERAZINE EDISYLATE 10 MG/2ML IJ SOLN
10.0000 mg | Freq: Once | INTRAMUSCULAR | Status: AC
  Administered 2022-12-25: 10 mg via INTRAVENOUS
  Filled 2022-12-25: qty 2

## 2022-12-25 MED ORDER — MECLIZINE HCL 25 MG PO TABS: 25.0000 mg | ORAL_TABLET | Freq: Three times a day (TID) | ORAL | 0 refills | Status: AC | PRN

## 2022-12-25 MED ORDER — GADOBUTROL 1 MMOL/ML IV SOLN
10.0000 mL | Freq: Once | INTRAVENOUS | Status: AC | PRN
  Administered 2022-12-25: 10 mL via INTRAVENOUS

## 2022-12-25 MED ORDER — LORAZEPAM 2 MG/ML IJ SOLN
1.0000 mg | Freq: Once | INTRAMUSCULAR | Status: AC
  Administered 2022-12-25: 1 mg via INTRAVENOUS
  Filled 2022-12-25: qty 1

## 2022-12-25 MED ORDER — DIPHENHYDRAMINE HCL 50 MG/ML IJ SOLN
12.5000 mg | Freq: Once | INTRAMUSCULAR | Status: AC
  Administered 2022-12-25: 12.5 mg via INTRAVENOUS
  Filled 2022-12-25: qty 1

## 2022-12-25 MED ORDER — KETOROLAC TROMETHAMINE 15 MG/ML IJ SOLN
15.0000 mg | Freq: Once | INTRAMUSCULAR | Status: AC
  Administered 2022-12-25: 15 mg via INTRAVENOUS
  Filled 2022-12-25: qty 1

## 2022-12-25 NOTE — ED Provider Notes (Signed)
[place] EMERGENCY DEPARTMENT AT [facility] Provider Note   CSN: [id number] Arrival date & time: 12/24/22  2015    History  Chief Complaint  Patient presents with   Weakness    Mikayla Humphrey is a 42 y.o. female  4pm Friday- blurred vision right eye, worsening 9 pm. Right arm weakness then. Works at [employer], seen in the ED, right leg goes numb. Pressure and tightness to both eyes when in the ED. Some pressure to head.  Admitted saw Optho- swelling to optic nerve  CT head- fine at [facility]  MR brain wo/w contrast per patient- Optic nerve swelling, re read and was "fine". Admitted. Thought TIA, needs ECHO outpatient. Possible optic neuritis and IIH? No LP At [facility]  Oct 4th Optho apt Neuro not scheduled yet  Discharged on Sunday- chose outpatient ECHO-- Nov 22  Sunday night, increased dizziness, hot and cold flashes, weakness in both extremities, upper. Monday gen weakness, SOB when goes upstairs. Tingling all over. Rest doesn't improve.  Continues blurred vision. Feels like she doesn't have on glasses now to both eyes. No floaters, diplopia, visual field cuts.   No sudden onset thunderclap leg, neck pain, chest pain, back pain, lower extremity swelling, PND, orthopnea, diplopia, eye pain, discharge.  HPI     Home Medications Prior to Admission medications   Medication Sig Start Date End Date Taking? Authorizing Provider  etonogestrel (NEXPLANON) 68 MG IMPL implant 68 mg by Subdermal route continuous.   Yes [provider]  meclizine (ANTIVERT) 25 MG tablet Take 1 tablet (25 mg total) by mouth 3 (three) times daily as needed for dizziness. 12/25/22  Yes Evadne Ose A, PA-C  WEGOVY 0.5 MG/0.5ML SOAJ Inject 0.5 mg into the skin once a week. 11/14/22  Yes [provider]      Allergies    Amoxicillin    Review of Systems   Review of Systems  Constitutional: Negative.   HENT: Negative.    Eyes:  Positive for visual disturbance.  Respiratory:  Negative.    Cardiovascular: Negative.   Gastrointestinal: Negative.   Genitourinary: Negative.   Musculoskeletal: Negative.   Skin: Negative.   Neurological:  Positive for weakness and numbness. Negative for dizziness, tremors, seizures, syncope, facial asymmetry, speech difficulty, light-headedness and headaches.  All other systems reviewed and are negative.   Physical Exam Updated Vital Signs BP (!) 133/99   Pulse 80   Temp 97.7 F (36.5 C) (Oral)   Resp 18   Ht 5\' 7"  (1.702 m)   Wt 104 kg   LMP  (LMP Unknown)   SpO2 99%   BMI 35.91 kg/m  Physical Exam Vitals and nursing note reviewed.  Constitutional:      General: She is not in acute distress.    Appearance: She is well-developed. She is not ill-appearing, toxic-appearing or diaphoretic.  HENT:     Head: Normocephalic and atraumatic.     Nose: Nose normal.     Mouth/Throat:     Mouth: Mucous membranes are moist.  Eyes:     Pupils: Pupils are equal, round, and reactive to light.  Cardiovascular:     Rate and Rhythm: Normal rate.     Pulses: Normal pulses.          Radial pulses are 2+ on the right side and 2+ on the left side.       Dorsalis pedis pulses are 2+ on the right side and 2+ on the left side.  Heart sounds: Normal heart sounds.  Pulmonary:     Effort: Pulmonary effort is normal. No respiratory distress.     Breath sounds: Normal breath sounds.     Comments: Clear bilaterally, speaks in full sentences without difficulty Abdominal:     General: Bowel sounds are normal. There is no distension.     Palpations: Abdomen is soft.     Tenderness: There is no abdominal tenderness. There is no right CVA tenderness, left CVA tenderness, guarding or rebound.     Comments: Soft, nontender  Musculoskeletal:        General: Normal range of motion.     Cervical back: Normal range of motion.     Comments: No bony tenderness, full range of motion, compartment soft.  No lower extremity edema  Skin:    General:  Skin is warm and dry.     Capillary Refill: Capillary refill takes less than 2 seconds.     Comments: No obvious rashes or lesions to exposed skin  Neurological:     Mental Status: She is alert.     Comments: Cranial nerves II through XII grossly intact Equal handgrip bilaterally Subjective decrease sensation to right upper extremity Equal strength lower extremity, biceps, plantarflexion, dorsiflexion PERRLA, no nystagmus  Psychiatric:        Mood and Affect: Mood normal.     ED Results / Procedures / Treatments   Labs (all labs ordered are listed, but only abnormal results are displayed) Labs Reviewed  CBC - Abnormal; Notable for the following components:      Result Value   Hemoglobin 11.6 (*)    All other components within normal limits  URINALYSIS, ROUTINE W REFLEX MICROSCOPIC - Abnormal; Notable for the following components:   Specific Gravity, Urine 1.031 (*)    All other components within normal limits  COMPREHENSIVE METABOLIC PANEL - Abnormal; Notable for the following components:   Calcium 8.0 (*)    Total Protein 6.2 (*)    Albumin 3.1 (*)    All other components within normal limits  SARS CORONAVIRUS 2 BY RT PCR  HCG, SERUM, QUALITATIVE  MAGNESIUM    EKG EKG Interpretation Date/Time:  Monday December 24 2022 20:53:41 EDT Ventricular Rate:  83 PR Interval:  134 QRS Duration:  76 QT Interval:  364 QTC Calculation: 427 R Axis:   67  Text Interpretation: Sinus rhythm with Premature supraventricular complexes and Premature ventricular complexes or Fusion complexes Otherwise normal ECG No significant change since last tracing Confirmed by Elayne Snare ([id number]) on 12/25/2022 7:09:47 AM  Radiology MR ORBITS W WO CONTRAST  Result Date: 12/25/2022 CLINICAL DATA:  Optic neuritis suspected blurred vision, weakness, recently seen at [facility] with MRI. EXAM: MRI OF THE ORBITS WITHOUT AND WITH CONTRAST TECHNIQUE: Multiplanar, multi-echo pulse sequences of the orbits and  surrounding structures were acquired including fat saturation techniques, before and after intravenous contrast administration. CONTRAST:  10mL GADAVIST GADOBUTROL 1 MMOL/ML IV SOLN COMPARISON:  None Available. FINDINGS: Orbits: No orbital mass or evidence of inflammation. Normal appearance of the globes, optic nerve-sheath complexes, extraocular muscles, orbital fat and lacrimal glands. Visualized sinuses: Mucous retention cyst in a right posterior ethmoid air cell. Soft tissues: Unremarkable. Limited intracranial: No acute or significant finding. IMPRESSION: Normal MRI of the orbits. Electronically Signed   By: Orvan Falconer M.D.   On: 12/25/2022 14:11   MR Cervical Spine W and Wo Contrast  Result Date: 12/25/2022 CLINICAL DATA:  Demyelinating disease, weakness. Right-sided weakness. EXAM: MRI CERVICAL  SPINE WITHOUT AND WITH CONTRAST TECHNIQUE: Multiplanar and multiecho pulse sequences of the cervical spine, to include the craniocervical junction and cervicothoracic junction, were obtained without and with intravenous contrast. CONTRAST:  10mL GADAVIST GADOBUTROL 1 MMOL/ML IV SOLN COMPARISON:  None Available. FINDINGS: Alignment: Normal. Vertebrae: No fracture, evidence of discitis, or bone lesion. Cord: Normal spinal cord signal and volume. No abnormal enhancement. Posterior Fossa, vertebral arteries, paraspinal tissues: Unremarkable. Disc levels: No significant disc herniation, spinal canal stenosis or neural foraminal narrowing. IMPRESSION: Normal MRI of the cervical spine. No evidence of demyelinating disease. Electronically Signed   By: Orvan Falconer M.D.   On: 12/25/2022 14:08   DG Chest 2 View  Result Date: 12/25/2022 CLINICAL DATA:  Shortness of breath. EXAM: CHEST - 2 VIEW COMPARISON:  None Available. FINDINGS: The heart size and mediastinal contours are within normal limits. Both lungs are clear. The visualized skeletal structures are unremarkable. IMPRESSION: No active cardiopulmonary  disease. Electronically Signed   By: Danae Orleans M.D.   On: 12/25/2022 08:16    Procedures Procedures    Medications Ordered in ED Medications  ketorolac (TORADOL) 15 MG/ML injection 15 mg (15 mg Intravenous Given 12/25/22 0749)  prochlorperazine (COMPAZINE) injection 10 mg (10 mg Intravenous Given 12/25/22 0748)  diphenhydrAMINE (BENADRYL) injection 12.5 mg (12.5 mg Intravenous Given 12/25/22 0749)  lactated ringers bolus 1,000 mL (0 mLs Intravenous Stopped 12/25/22 0851)  LORazepam (ATIVAN) injection 1 mg (1 mg Intravenous Given 12/25/22 1141)  gadobutrol (GADAVIST) 1 MMOL/ML injection 10 mL (10 mLs Intravenous Contrast Given 12/25/22 1208)   ED Course/ Medical Decision Making/ A&P Clinical Course as of 12/25/22 1436  Tue Dec 25, 2022  1023 CONSULT with Dr. Amada Jupiter, Neurology who recommends MR orbits as well as MR cervical spine.  If negative patient needs a follow-up outpatient [BH]    Clinical Course User Index [BH] Miquela Costabile A, PA-C   42 year old here for evaluation of vision changes and numbness to right upper extremity.  Recently hospitalized for similar [facility], discharged 3 days ago.  Still having symptoms.  Has some subjective weakness to right upper extremity however no objective changes.  Reviewed her labs and imaging from [facility], she was post to follow-up with neurology and ophthalmology.  She comes in here due to symptoms not improving.  She has not followed up outpatient yet.  No sudden onset thunderclap headache.  She did not have an LP while she was inpatient or echocardiogram.  I reviewed their notes thought initially she had optic neuritis however further evaluation neurology note states MRI did not show any changes.  She has no ascending or descending weakness to suggest Guillain-Barr, tetanus, exam and history not consistent with myasthenia gravis.  Has some pressure behind eyes however no gross headache, low suspicion for IIH. Will plan on labs, touching  base with neurology.  Low suspicion for dissection. give migraine cocktail in case this is a complicated migraine causing her symptoms.  No eye pain, low suspicion for acute angle glaucoma  Neurology is recommending MR orbits as well as MR cervical spine, did not feel we need to repeat brain at this time.  If this is negative he recommends following up with Optho and neurology in the outpatient setting  Labs and imaging personally viewed and interpreted:  UA negative for infection CBC without leukocytosis COVID-negative Metabolic panel without significant findings Magnesium 2.2 Pregnancy test Chest x-ray without significant abnormality MR orbits no significant abnormality MR cervical no significant abnormality  Patient reassessed.  Pressure behind eyes resolved with migraine cocktail however still feels weak as well as vision changes.  I discussed neurology recommendations for repeating imaging and adding on cervical spine which she is agreeable for.  Patient reassessed.  Sleeping, pending MRI results  Patient reassessed.  We discussed her MRI findings.  No acute abnormality.  Ambulatory here without ataxic gait.  Will have her follow-up with ophthalmology as well as neurology outpatient.  She will return for new or worsening symptoms  The patient has been appropriately medically screened and/or stabilized in the ED. I have low suspicion for any other emergent medical condition which would require further screening, evaluation or treatment in the ED or require inpatient management.  Patient is hemodynamically stable and in no acute distress.  Patient able to ambulate in department prior to ED.  Evaluation does not show acute pathology that would require ongoing or additional emergent interventions while in the emergency department or further inpatient treatment.  I have discussed the diagnosis with the patient and answered all questions.  Pain is been managed while in the emergency  department and patient has no further complaints prior to discharge.  Patient is comfortable with plan discussed in room and is stable for discharge at this time.  I have discussed strict return precautions for returning to the emergency department.  Patient was encouraged to follow-up with PCP/specialist refer to at discharge.                                 Medical Decision Making Amount and/or Complexity of Data Reviewed External Data Reviewed: labs, radiology, ECG and notes. Labs: ordered. Decision-making details documented in ED Course. Radiology: ordered and independent interpretation performed. Decision-making details documented in ED Course. ECG/medicine tests: ordered and independent interpretation performed. Decision-making details documented in ED Course.  Risk OTC drugs. Prescription drug management. Parenteral controlled substances. Decision regarding hospitalization. Diagnosis or treatment significantly limited by social determinants of health.         Final Clinical Impression(s) / ED Diagnoses Final diagnoses:  Weakness  Vision changes    Rx / DC Orders ED Discharge Orders          Ordered    Ambulatory referral to Neurology       Comments: An appointment is requested in approximately: 2 weeks   12/25/22 1409    meclizine (ANTIVERT) 25 MG tablet  3 times daily PRN        12/25/22 1434              Stasia Somero A, PA-C 12/25/22 1436    Rexford Maus, DO 12/25/22 1548

## 2022-12-25 NOTE — ED Notes (Signed)
Called multiple times for blood drawl no answer

## 2022-12-25 NOTE — Discharge Instructions (Addendum)
It was a pleasure taking care of you here in the emergency department we discussed with our neurologist who recommended repeat imaging.  The neurologist here would like you to follow-up outpatient with neurology.  I have placed an urgent referral to [facility] neurology.  If you do not hear back from them within the next 2 days schedule an appointment, please call with know you are seen here in the emergency department  I have also written the phone number down to the on-call ophthalmologist here from the emergency department.  Please call to schedule an appointment with them.  If you tell them you are seen in the emergency department they typically can get you in the next 1 to 2 days.  Make sure to follow-up outpatient, return for any worsening symptoms.

## 2022-12-25 NOTE — ED Notes (Addendum)
Patient transported to MRI 

## 2023-01-08 ENCOUNTER — Ambulatory Visit: Payer: PRIVATE HEALTH INSURANCE | Admitting: Diagnostic Neuroimaging

## 2023-01-08 ENCOUNTER — Encounter: Payer: Self-pay | Admitting: Diagnostic Neuroimaging

## 2023-01-08 VITALS — BP 116/72 | HR 70 | Ht 67.0 in | Wt 257.0 lb

## 2023-01-08 DIAGNOSIS — H538 Other visual disturbances: Secondary | ICD-10-CM | POA: Diagnosis not present

## 2023-01-08 NOTE — Patient Instructions (Signed)
  BLURRED VISION, NUMBNESS, HEAD PRESSURE  - check LP for opening pressure (idiopathic intracranial hypertension (pseudotumor cerebri) evaluation) - continue wegovy for weight mgmt

## 2023-01-08 NOTE — Progress Notes (Signed)
[facility]  PATIENT: Mikayla Humphrey DOB: 11-12-1980  REFERRING CLINICIAN: Henderly, Britni A, PA-C HISTORY FROM: patient  REASON FOR VISIT: new consult   HISTORICAL  CHIEF COMPLAINT:  Chief Complaint  Patient presents with   New Patient (Initial Visit)    RM 6, here with  Pt is here for weakness and vision changes. States on 9/13 she developed right eye blurriness, right arm/leg weakness, went to ER MRI/Ct normal. Weakness/fatigue and blurry vision on and off continues since ER visit. Pt states has appointment scheduled with Ophthalmology on  01/11/23.     HISTORY OF PRESENT ILLNESS:   42 year old female here for evaluation of blurred vision, head pressure, fatigue, numbness.  Symptoms started a few weeks ago with right eye blurred vision, right arm and right leg numbness.  Went to the [facility] for evaluation, had MRI of the brain and orbits, lab testing, ophthalmology evaluation and discharged home.  Due to ongoing pressure, fatigue and vision changes she went back to the [facility] Emergency room for additional evaluation.  Had additional MRI of the orbits and cervical spine which were unremarkable.  Mention of slight elevation of optic nerves on exam raised possibility of idiopathic intracranial hypertension.  Recommend follow-up in outpatient clinic.   REVIEW OF SYSTEMS: Full 14 system review of systems performed and negative with exception of: as per HPI.  ALLERGIES: Allergies  Allergen Reactions   Amoxicillin Hives and Rash    HOME MEDICATIONS: Outpatient Medications Prior to Visit  Medication Sig Dispense Refill   etonogestrel (NEXPLANON) 68 MG IMPL implant 68 mg by Subdermal route continuous.     Iron-Vitamin C (IRON 100/C PO) Take by mouth.     WEGOVY 0.5 MG/0.5ML SOAJ Inject 0.5 mg into the skin once a week.     meclizine (ANTIVERT) 25 MG tablet Take 1 tablet (25 mg total) by mouth 3 (three) times daily as needed for dizziness. (Patient not  taking: Reported on 01/08/2023) 30 tablet 0   No facility-administered medications prior to visit.    PAST MEDICAL HISTORY: Past Medical History:  Diagnosis Date   Anxiety    Dizziness    Endometriosis 06/08/2010   Generalized headaches    History of anal fissures    History of anemia    History of herpes genitalis    Hyperlipidemia    diet controlled, no meds   Left ovarian cyst    Panic attacks    Pelvic pain    Wears glasses     PAST SURGICAL HISTORY: Past Surgical History:  Procedure Laterality Date   CESAREAN SECTION  2001   @[facility]   CESAREAN SECTION N/A 03/19/2014   Procedure: CESAREAN SECTION;  Surgeon: Leslie Andrea, MD;  Location: [facility];  Service: Obstetrics;  Laterality: N/A;  repeat  edc 03/26/14   CESAREAN SECTION N/A 10/21/2015   Procedure: REPEAT CESAREAN SECTION;  Surgeon: Harold Hedge, MD;  Location: [facility];  Service: Obstetrics;  Laterality: N/A;  Requested RNFA  Tracey T   DIAGNOSTIC LAPAROSCOPY  02-23-2010   @[facility]    W/  LYSIS ADHESIONS AND BX'S AND C/S SCAR INCISION REVISION   LAPAROSCOPIC OVARIAN CYSTECTOMY Left 12/10/2019   Procedure: DIAGNOSTIC LAPAROSCOPY, POSSIBLE LAPAROSCOPIC OVARIAN CYSTECTOMY, POSSIBLE OOPHORECTOMY;  Surgeon: Harold Hedge, MD;  Location: [facility];  Service: Gynecology;  Laterality: Left;   WISDOM TOOTH EXTRACTION      FAMILY HISTORY: Family History  Problem Relation Age of Onset   Bipolar disorder Sister  Hypertension Maternal Grandmother    Stroke Paternal Grandmother     SOCIAL HISTORY: Social History   Socioeconomic History   Marital status: Married    Spouse name: Not on file   Number of children: 1   Years of education: college grad   Highest education level: Not on file  Occupational History    Comment: Print production planner  Tobacco Use   Smoking status: Never   Smokeless tobacco: Never  Vaping Use   Vaping status: Never Used  Substance and Sexual Activity   Alcohol use: No    Drug use: Never   Sexual activity: Yes    Birth control/protection: I.U.D.  Other Topics Concern   Not on file  Social History Narrative   Lives with family   Caffeine- yes   Social Determinants of Health   Financial Resource Strain: Not on file  Food Insecurity: Low Risk  (11/14/2022)   Received from [facility]   Hunger Vital Sign    Worried About Running Out of Food in the Last Year: Never true    Ran Out of Food in the Last Year: Never true  Transportation Needs: Not on file (11/14/2022)  Physical Activity: Not on file  Stress: Not on file  Social Connections: Unknown (08/12/2021)   Received from [facility]   Social Network    Social Network: Not on file  Intimate Partner Violence: Unknown (07/10/2021)   Received from [facility], [facility]   HITS    Physically Hurt: Not on file    Insult or Talk Down To: Not on file    Threaten Physical Harm: Not on file    Scream or Curse: Not on file     PHYSICAL EXAM  GENERAL EXAM/CONSTITUTIONAL: Vitals:  Vitals:   01/08/23 1015  BP: 116/72  Pulse: 70  Weight: 257 lb (116.6 kg)  Height: 5\' 7"  (1.702 m)   Body mass index is 40.25 kg/m. Wt Readings from Last 3 Encounters:  01/08/23 257 lb (116.6 kg)  12/25/22 229 lb 4.5 oz (104 kg)  06/09/20 228 lb 6.4 oz (103.6 kg)   Patient is in no distress; well developed, nourished and groomed; neck is supple  CARDIOVASCULAR: Examination of carotid arteries is normal; no carotid bruits Regular rate and rhythm, no murmurs Examination of peripheral vascular system by observation and palpation is normal  EYES: Ophthalmoscopic exam of optic discs and posterior segments is normal; no papilledema or hemorrhages No results found.  MUSCULOSKELETAL: Gait, strength, tone, movements noted in Neurologic exam below  NEUROLOGIC: MENTAL STATUS:      No data to display         awake, alert, oriented to person, place and time recent and remote memory  intact normal attention and concentration language fluent, comprehension intact, naming intact fund of knowledge appropriate  CRANIAL NERVE:  2nd - no papilledema on fundoscopic exam 2nd, 3rd, 4th, 6th - pupils equal and reactive to light, visual fields full to confrontation, extraocular muscles intact, no nystagmus 5th - facial sensation symmetric 7th - facial strength symmetric 8th - hearing intact 9th - palate elevates symmetrically, uvula midline 11th - shoulder shrug symmetric 12th - tongue protrusion midline  MOTOR:  normal bulk and tone, full strength in the BUE, BLE  SENSORY:  normal and symmetric to light touch, temperature, vibration  COORDINATION:  finger-nose-finger, fine finger movements normal  REFLEXES:  deep tendon reflexes TRACE and symmetric  GAIT/STATION:  narrow based gait     DIAGNOSTIC DATA (  LABS, IMAGING, TESTING) - I reviewed patient records, labs, notes, testing and imaging myself where available.  Lab Results  Component Value Date   WBC 5.6 12/25/2022   HGB 11.6 (L) 12/25/2022   HCT 37.6 12/25/2022   MCV 86.2 12/25/2022   PLT 179 12/25/2022      Component Value Date/Time   NA 135 12/25/2022 0935   K 3.5 12/25/2022 0935   CL 105 12/25/2022 0935   CO2 24 12/25/2022 0935   GLUCOSE 82 12/25/2022 0935   BUN 11 12/25/2022 0935   CREATININE 0.66 12/25/2022 0935   CALCIUM 8.0 (L) 12/25/2022 0935   PROT 6.2 (L) 12/25/2022 0935   ALBUMIN 3.1 (L) 12/25/2022 0935   AST 26 12/25/2022 0935   ALT 17 12/25/2022 0935   ALKPHOS 54 12/25/2022 0935   BILITOT 0.7 12/25/2022 0935   GFRNONAA >60 12/25/2022 0935   GFRAA >60 12/07/2019 0923   No results found for: "CHOL", "HDL", "LDLCALC", "LDLDIRECT", "TRIG", "CHOLHDL" No results found for: "HGBA1C" No results found for: "VITAMINB12" No results found for: "TSH"   12/22/22 Normal MRI of the orbits. No acute intracranial abnormality.   12/25/22 Normal MRI of the orbits.  12/25/22 Normal MRI of the  cervical spine. No evidence of demyelinating disease.   ASSESSMENT AND PLAN  42 y.o. year old female here with:   Dx:  1. Blurred vision     PLAN:  BLURRED VISION, NUMBNESS, HEAD PRESSURE  - check LP for opening pressure (idiopathic intracranial hypertension (pseudotumor cerebri) evaluation) - continue wegovy for weight mgmt  Orders Placed This Encounter  Procedures   DG FL GUIDED LUMBAR PUNCTURE   Return for pending if symptoms worsen or fail to improve, pending test results.    Suanne Marker, MD 01/08/2023, 10:56 AM Certified in Neurology, Neurophysiology and Neuroimaging  [facility] [address], [address] [place], [place] [address] [phone]

## 2023-01-16 NOTE — Discharge Instructions (Signed)
Lumbar Puncture Discharge Instructions  Go home and rest quietly as needed. You may resume normal activities; however, do not exert yourself strongly or do any heavy lifting today and tomorrow.   DO NOT drive today.    You may resume your normal diet and medications unless otherwise indicated. Drink lots of extra fluids today and tomorrow.   The incidence of headache, nausea, or vomiting is about 5% (one in 20 patients).  If you develop a headache, lie flat for 24 hours and drink plenty of fluids until the headache goes away.  Caffeinated beverages may be helpful. If when you get up you still have a headache when standing, go back to bed and force fluids for another 24 hours.   If you develop severe nausea and vomiting or a headache that does not go away with the flat bedrest after 48 hours, please call [phone].   Call your physician for a follow-up appointment.  The results of your Lumbar Puncture will be sent directly to your physician and they will contact you.   If you have any questions or if complications develop after you arrive home, please call [phone].  Discharge instructions have been explained to the patient.  The patient, or the person responsible for the patient, fully understands these instructions.   Thank you for visiting our office today.   YOU MAY RESUME YOUR ASPIRIN TODAY.

## 2023-01-17 ENCOUNTER — Ambulatory Visit
Admission: RE | Admit: 2023-01-17 | Discharge: 2023-01-17 | Disposition: A | Discharge status: Home / Self Care | Payer: PRIVATE HEALTH INSURANCE | Source: Ambulatory Visit | Attending: Diagnostic Neuroimaging | Admitting: Diagnostic Neuroimaging

## 2023-01-17 VITALS — BP 115/67 | HR 71

## 2023-01-17 DIAGNOSIS — H538 Other visual disturbances: Secondary | ICD-10-CM

## 2023-01-21 LAB — CSF CULTURE W GRAM STAIN
MICRO NUMBER:: 15578202
Result:: NO GROWTH
SPECIMEN QUALITY:: ADEQUATE

## 2023-01-21 LAB — CSF CELL COUNT WITH DIFFERENTIAL
RBC Count, CSF: 50 {cells}/uL — ABNORMAL HIGH
TOTAL NUCLEATED CELL: 0 {cells}/uL (ref 0–5)

## 2023-01-21 LAB — PROTEIN, CSF: Total Protein, CSF: 15 mg/dL (ref 15–45)

## 2023-01-21 LAB — GLUCOSE, CSF: Glucose, CSF: 57 mg/dL (ref 40–80)

## 2023-02-20 ENCOUNTER — Telehealth: Payer: Self-pay | Admitting: Diagnostic Neuroimaging

## 2023-02-20 NOTE — Telephone Encounter (Signed)
Pt is asking for a call to discuss results to her spinal tap

## 2023-02-26 NOTE — Telephone Encounter (Signed)
Normal opening pressure . Unremarkable CSF labs.  Continue weight management strategies.-VRP

## 2024-03-28 ENCOUNTER — Emergency Department (HOSPITAL_COMMUNITY)

## 2024-03-28 ENCOUNTER — Emergency Department (HOSPITAL_COMMUNITY)
Admission: EM | Admit: 2024-03-28 | Discharge: 2024-03-28 | Disposition: A | Discharge status: Home / Self Care | Attending: Emergency Medicine | Admitting: Emergency Medicine

## 2024-03-28 ENCOUNTER — Other Ambulatory Visit: Payer: Self-pay

## 2024-03-28 ENCOUNTER — Encounter (HOSPITAL_COMMUNITY): Payer: Self-pay | Admitting: Emergency Medicine

## 2024-03-28 DIAGNOSIS — K8070 Calculus of gallbladder and bile duct without cholecystitis without obstruction: Secondary | ICD-10-CM | POA: Insufficient documentation

## 2024-03-28 DIAGNOSIS — R739 Hyperglycemia, unspecified: Secondary | ICD-10-CM

## 2024-03-28 DIAGNOSIS — R1084 Generalized abdominal pain: Secondary | ICD-10-CM | POA: Diagnosis present

## 2024-03-28 DIAGNOSIS — K8020 Calculus of gallbladder without cholecystitis without obstruction: Secondary | ICD-10-CM

## 2024-03-28 DIAGNOSIS — D649 Anemia, unspecified: Secondary | ICD-10-CM | POA: Insufficient documentation

## 2024-03-28 DIAGNOSIS — R7309 Other abnormal glucose: Secondary | ICD-10-CM | POA: Insufficient documentation

## 2024-03-28 DIAGNOSIS — K8050 Calculus of bile duct without cholangitis or cholecystitis without obstruction: Secondary | ICD-10-CM

## 2024-03-28 LAB — COMPREHENSIVE METABOLIC PANEL WITH GFR
ALT: 8 U/L (ref 0–44)
AST: 37 U/L (ref 15–41)
Albumin: 3.8 g/dL (ref 3.5–5.0)
Alkaline Phosphatase: 85 U/L (ref 38–126)
Anion gap: 7 (ref 5–15)
BUN: 12 mg/dL (ref 6–20)
CO2: 28 mmol/L (ref 22–32)
Calcium: 8.5 mg/dL — ABNORMAL LOW (ref 8.9–10.3)
Chloride: 105 mmol/L (ref 98–111)
Creatinine, Ser: 0.7 mg/dL (ref 0.44–1.00)
GFR, Estimated: >60 mL/min
Glucose, Bld: 104 mg/dL — ABNORMAL HIGH (ref 70–99)
Potassium: 3.8 mmol/L (ref 3.5–5.1)
Sodium: 139 mmol/L (ref 135–145)
Total Bilirubin: 0.4 mg/dL (ref 0.0–1.2)
Total Protein: 6.5 g/dL (ref 6.5–8.1)

## 2024-03-28 LAB — CBC WITH DIFFERENTIAL/PLATELET
Abs Immature Granulocytes: 0.01 K/uL (ref 0.00–0.07)
Basophils Absolute: 0 K/uL (ref 0.0–0.1)
Basophils Relative: 0 %
Eosinophils Absolute: 0.1 K/uL (ref 0.0–0.5)
Eosinophils Relative: 2 %
HCT: 32.9 % — ABNORMAL LOW (ref 36.0–46.0)
Hemoglobin: 10.3 g/dL — ABNORMAL LOW (ref 12.0–15.0)
Immature Granulocytes: 0 %
Lymphocytes Relative: 29 %
Lymphs Abs: 1.9 K/uL (ref 0.7–4.0)
MCH: 27.4 pg (ref 26.0–34.0)
MCHC: 31.3 g/dL (ref 30.0–36.0)
MCV: 87.5 fL (ref 80.0–100.0)
Monocytes Absolute: 0.4 K/uL (ref 0.1–1.0)
Monocytes Relative: 6 %
Neutro Abs: 4 K/uL (ref 1.7–7.7)
Neutrophils Relative %: 63 %
Platelets: 184 K/uL (ref 150–400)
RBC: 3.76 MIL/uL — ABNORMAL LOW (ref 3.87–5.11)
RDW: 13.5 % (ref 11.5–15.5)
WBC: 6.4 K/uL (ref 4.0–10.5)
nRBC: 0 % (ref 0.0–0.2)

## 2024-03-28 LAB — I-STAT CHEM 8, ED
BUN: 15 mg/dL (ref 6–20)
Calcium, Ion: 1.09 mmol/L — ABNORMAL LOW (ref 1.15–1.40)
Chloride: 102 mmol/L (ref 98–111)
Creatinine, Ser: 0.7 mg/dL (ref 0.44–1.00)
Glucose, Bld: 101 mg/dL — ABNORMAL HIGH (ref 70–99)
HCT: 32 % — ABNORMAL LOW (ref 36.0–46.0)
Hemoglobin: 10.9 g/dL — ABNORMAL LOW (ref 12.0–15.0)
Potassium: 4.5 mmol/L (ref 3.5–5.1)
Sodium: 138 mmol/L (ref 135–145)
TCO2: 27 mmol/L (ref 22–32)

## 2024-03-28 LAB — URINALYSIS, ROUTINE W REFLEX MICROSCOPIC
Bilirubin Urine: NEGATIVE
Glucose, UA: NEGATIVE mg/dL
Hgb urine dipstick: NEGATIVE
Ketones, ur: NEGATIVE mg/dL
Leukocytes,Ua: NEGATIVE
Nitrite: NEGATIVE
Protein, ur: 30 mg/dL — AB
Specific Gravity, Urine: 1.026 (ref 1.005–1.030)
pH: 8 (ref 5.0–8.0)

## 2024-03-28 LAB — TROPONIN T, HIGH SENSITIVITY
Troponin T High Sensitivity: <15 ng/L (ref 0–19)
Troponin T High Sensitivity: <15 ng/L (ref 0–19)

## 2024-03-28 LAB — HCG, SERUM, QUALITATIVE: Preg, Serum: NEGATIVE

## 2024-03-28 MED ORDER — TRAMADOL HCL 50 MG PO TABS: 50.0000 mg | ORAL_TABLET | Freq: Four times a day (QID) | ORAL | 0 refills | Status: AC | PRN

## 2024-03-28 MED ORDER — ONDANSETRON 4 MG PO TBDP: 4.0000 mg | ORAL_TABLET | Freq: Three times a day (TID) | ORAL | 0 refills | Status: AC | PRN

## 2024-03-28 MED ORDER — SODIUM CHLORIDE 0.9 % IV BOLUS
1000.0000 mL | Freq: Once | INTRAVENOUS | Status: AC
  Administered 2024-03-28: 1000 mL via INTRAVENOUS

## 2024-03-28 MED ORDER — MORPHINE SULFATE (PF) 4 MG/ML IV SOLN
4.0000 mg | Freq: Once | INTRAVENOUS | Status: AC
  Administered 2024-03-28: 4 mg via INTRAVENOUS
  Filled 2024-03-28: qty 1

## 2024-03-28 MED ORDER — IOHEXOL 300 MG/ML  SOLN
100.0000 mL | Freq: Once | INTRAMUSCULAR | Status: AC | PRN
  Administered 2024-03-28: 100 mL via INTRAVENOUS

## 2024-03-28 NOTE — ED Triage Notes (Signed)
 Pt BIB [facility] from home. Sts she was woken up d/t sudden sharp upper abdominal and centralized non radiating chest pain. No SOB. Endorses she was clammy at that time. Shortly after went to the bathroom d/t urge for BM. Had 1 epsiode of emesis.  Meds given PTA 324 asa, 200 fent, and 4 zofran 

## 2024-03-28 NOTE — Discharge Instructions (Signed)
 Stay on a low-fat diet until you see the surgeon.  At any point if pain is not being adequately controlled at home, return to the emergency department.

## 2024-03-28 NOTE — ED Provider Notes (Signed)
 " [place] EMERGENCY DEPARTMENT AT [facility] Provider Note   CSN: [id number] Arrival date & time: 03/28/24  9693     Patient presents with: Abdominal Pain and Chest Pain   Mikayla Humphrey is a 43 y.o. female.   The history is provided by the patient.  Abdominal Pain Associated symptoms: chest pain   Chest Pain Associated symptoms: abdominal pain    She has history of hyperlipidemia and was awakened by severe, generalized abdominal pain which did radiate to the chest.  There was associated nausea and vomiting and diaphoresis.  She denies dyspnea.  She had eaten a wing salad before going to bed.  She came in by ambulance where she received aspirin, ondansetron , fentanyl  and is feeling somewhat better now.  She has never had pain like this before.  There is no radiation of pain to her back.  She is a non-smoker with no history of hypertension or diabetes and there is no family history of premature coronary atherosclerosis.    Prior to Admission medications  Medication Sig Start Date End Date Taking? Authorizing Provider  etonogestrel (NEXPLANON) 68 MG IMPL implant 68 mg by Subdermal route continuous.    [provider]  Iron-Vitamin C (IRON 100/C PO) Take by mouth.    [provider]  meclizine  (ANTIVERT ) 25 MG tablet Take 1 tablet (25 mg total) by mouth 3 (three) times daily as needed for dizziness. Patient not taking: Reported on 01/08/2023 12/25/22   Henderly, Britni A, PA-C  WEGOVY 0.5 MG/0.5ML SOAJ Inject 0.5 mg into the skin once a week. 11/14/22   [provider]    Allergies: Amoxicillin    Review of Systems  Cardiovascular:  Positive for chest pain.  Gastrointestinal:  Positive for abdominal pain.  All other systems reviewed and are negative.   Updated Vital Signs BP 113/74 (BP Location: Right Wrist)   Pulse 65   Temp 97.6 F (36.4 C) (Oral)   Resp 15   SpO2 99%   Physical Exam Vitals and nursing note reviewed.   43 year  old female, resting comfortably and in no acute distress. Vital signs are normal. Oxygen saturation is 99%, which is normal. Head is normocephalic and atraumatic. PERRLA, EOMI. Lungs are clear without rales, wheezes, or rhonchi. Chest is nontender. Heart has regular rate and rhythm without murmur. Abdomen is soft, flat, with mild abdominal tenderness diffusely.  Negative Murphy sign. Extremities have no cyanosis or edema, full range of motion is present. Skin is warm and dry without rash. Neurologic: Mental status is normal, cranial nerves are intact, moves all extremities equally.  (all labs ordered are listed, but only abnormal results are displayed) Labs Reviewed  COMPREHENSIVE METABOLIC PANEL WITH GFR - Abnormal; Notable for the following components:      Result Value   Glucose, Bld 104 (*)    Calcium 8.5 (*)    All other components within normal limits  CBC WITH DIFFERENTIAL/PLATELET - Abnormal; Notable for the following components:   RBC 3.76 (*)    Hemoglobin 10.3 (*)    HCT 32.9 (*)    All other components within normal limits  URINALYSIS, ROUTINE W REFLEX MICROSCOPIC - Abnormal; Notable for the following components:   Protein, ur 30 (*)    Bacteria, UA RARE (*)    All other components within normal limits  I-STAT CHEM 8, ED - Abnormal; Notable for the following components:   Glucose, Bld 101 (*)    Calcium, Ion 1.09 (*)  Hemoglobin 10.9 (*)    HCT 32.0 (*)    All other components within normal limits  HCG, SERUM, QUALITATIVE  I-STAT CREATININE, ED  TROPONIN T, HIGH SENSITIVITY  TROPONIN T, HIGH SENSITIVITY    EKG: EKG Interpretation Date/Time:  Saturday March 28 2024 03:45:43 EST Ventricular Rate:  63 PR Interval:  118 QRS Duration:  88 QT Interval:  445 QTC Calculation: 456 R Axis:   64  Text Interpretation: Sinus rhythm When compared with ECG of 12/24/2022, No significant change was found Borderline short PR interval Abnormal R-wave progression, early  transition Confirmed by Raford Lenis ([id number]) on 03/28/2024 3:57:24 AM  Radiology: US  Abdomen Limited Result Date: 03/28/2024 EXAM: Right Upper Quadrant Abdominal Ultrasound 03/28/2024 06:55:00 AM TECHNIQUE: Real-time ultrasonography of the right upper quadrant of the abdomen was performed. COMPARISON: 03/28/2024 CLINICAL HISTORY: RUQ pain. FINDINGS: LIVER: Normal echogenicity. No intrahepatic biliary ductal dilatation. No evidence of mass. Hepatopetal flow in the portal vein. BILIARY SYSTEM: There is a 2.5 x 2.2 cm stone within the gallbladder neck. No gallbladder wall thickening, pericholecystic fluid, sludge, or sonographic Murphy sign. The common bile duct is within normal limits measuring 2.2 mm. RIGHT KIDNEY: No hydronephrosis. No echogenic calculi. No mass. PANCREAS: Visualized portions of the pancreas are unremarkable. OTHER: No right upper quadrant ascites. IMPRESSION: 1. 2.5 x 2.2 cm stone within the gallbladder neck. 2. No secondary signs of of acute cholecystitis. If there is a high clinical concern for acute cholecystitis and further imaging is clinically indicated, a nuclear medicine hepatobiliary scan would be recommended. Electronically signed by: Waddell Calk MD 03/28/2024 07:30 AM EST RP Workstation: [id number]   CT ABDOMEN PELVIS W CONTRAST Result Date: 03/28/2024 EXAM: CT ABDOMEN AND PELVIS WITH CONTRAST 03/28/2024 04:44:43 AM TECHNIQUE: CT of the abdomen and pelvis was performed with the administration of intravenous contrast. Multiplanar reformatted images are provided for review. Automated exposure control, iterative reconstruction, and/or weight-based adjustment of the mA/kV was utilized to reduce the radiation dose to as low as reasonably achievable. COMPARISON: None available. CLINICAL HISTORY: Abdominal pain, acute, nonlocalized FINDINGS: LOWER CHEST: No acute abnormality. LIVER: The liver is unremarkable. GALLBLADDER AND BILE DUCTS: Gallbladder is unremarkable. No biliary ductal  dilatation. SPLEEN: No acute abnormality. PANCREAS: No acute abnormality. ADRENAL GLANDS: No acute abnormality. KIDNEYS, URETERS AND BLADDER: No stones in the kidneys or ureters. No hydronephrosis. No perinephric or periureteral stranding. Urinary bladder is unremarkable. GI AND BOWEL: Stomach demonstrates no acute abnormality. There is no bowel obstruction. PERITONEUM AND RETROPERITONEUM: No ascites. No free air. VASCULATURE: Aorta is normal in caliber. LYMPH NODES: No lymphadenopathy. REPRODUCTIVE ORGANS: No acute abnormality. BONES AND SOFT TISSUES: No acute osseous abnormality. No focal soft tissue abnormality. IMPRESSION: 1. No acute findings in the abdomen or pelvis. Electronically signed by: Evalene Coho MD 03/28/2024 05:09 AM EST RP Workstation: [id number]    Cardiac monitor shows normal sinus rhythm, per my interpretation.  Procedures   Medications Ordered in the ED  sodium chloride  0.9 % bolus 1,000 mL (0 mLs Intravenous Stopped 03/28/24 0526)  iohexol  (OMNIPAQUE ) 300 MG/ML solution 100 mL (100 mLs Intravenous Contrast Given 03/28/24 0439)  morphine  (PF) 4 MG/ML injection 4 mg (4 mg Intravenous Given 03/28/24 9272)                                    Medical Decision Making Amount and/or Complexity of Data Reviewed Labs: ordered. Radiology: ordered.  Risk Prescription drug  management.   Generalized abdominal pain.  Physical findings may be blunted by the fact that she had received fentanyl  prior to my seeing her.  However, exam does not point to a specific cause.  Differential diagnosis includes, but is not limited to, appendicitis, cholecystitis, pancreatitis, diverticulitis, urolithiasis, urinary tract infection, ACS.  I have reviewed her electrocardiogram, my interpretation is early repolarization unchanged from prior.  I have ordered laboratory workup and CT of abdomen and pelvis.  I have reviewed her laboratory tests, and my interpretation is slight proteinuria and  otherwise normal urinalysis, borderline elevated random glucose level and otherwise normal comprehensive metabolic panel, normal troponin x 2, mild anemia, normal WBC.  CT of abdomen and pelvis shows no acute findings in the abdomen or pelvis.  Have independently viewed the images, and agree with the radiologist's interpretation.  I have ordered a right upper quadrant ultrasound to evaluate for possible radiolucent gallstones.  She continues to be pain-free.    Following ultrasound, patient had mild recurrence of pain and I ordered a dose of morphine .  Ultrasound does show a gallstone in the gallbladder neck.  This is likely the cause of her pain tonight.  I am discharging her with a prescription for ondansetron  oral dissolving tablet and a prescription for a small number of tramadol  tablets.  I am referring her to general surgery for follow-up, advised to stay on a low fat diet until then.  Return precautions discussed.     Final diagnoses:  Biliary colic  Calculus of gallbladder without cholecystitis without obstruction  Elevated random blood glucose level  Normochromic normocytic anemia    ED Discharge Orders          Ordered    ondansetron  (ZOFRAN -ODT) 4 MG disintegrating tablet  Every 8 hours PRN        03/28/24 0751    traMADol  (ULTRAM ) 50 MG tablet  Every 6 hours PRN        03/28/24 0751               Raford Lenis, MD 03/28/24 [phone]  "

## 2024-03-29 ENCOUNTER — Emergency Department (HOSPITAL_COMMUNITY)
Admission: EM | Admit: 2024-03-29 | Discharge: 2024-03-30 | Disposition: A | Discharge status: Home / Self Care | Attending: Emergency Medicine | Admitting: Emergency Medicine

## 2024-03-29 ENCOUNTER — Other Ambulatory Visit: Payer: Self-pay

## 2024-03-29 ENCOUNTER — Encounter (HOSPITAL_COMMUNITY): Payer: Self-pay

## 2024-03-29 DIAGNOSIS — R110 Nausea: Secondary | ICD-10-CM | POA: Insufficient documentation

## 2024-03-29 DIAGNOSIS — M25511 Pain in right shoulder: Secondary | ICD-10-CM | POA: Insufficient documentation

## 2024-03-29 DIAGNOSIS — M25512 Pain in left shoulder: Secondary | ICD-10-CM | POA: Insufficient documentation

## 2024-03-29 DIAGNOSIS — R109 Unspecified abdominal pain: Secondary | ICD-10-CM

## 2024-03-29 DIAGNOSIS — R1084 Generalized abdominal pain: Secondary | ICD-10-CM | POA: Diagnosis present

## 2024-03-29 DIAGNOSIS — M545 Low back pain, unspecified: Secondary | ICD-10-CM | POA: Diagnosis not present

## 2024-03-29 LAB — HCG, SERUM, QUALITATIVE: Preg, Serum: NEGATIVE

## 2024-03-29 LAB — COMPREHENSIVE METABOLIC PANEL WITH GFR
ALT: 8 U/L (ref 0–44)
AST: 26 U/L (ref 15–41)
Albumin: 3.9 g/dL (ref 3.5–5.0)
Alkaline Phosphatase: 70 U/L (ref 38–126)
Anion gap: 7 (ref 5–15)
BUN: 12 mg/dL (ref 6–20)
CO2: 28 mmol/L (ref 22–32)
Calcium: 8.7 mg/dL — ABNORMAL LOW (ref 8.9–10.3)
Chloride: 100 mmol/L (ref 98–111)
Creatinine, Ser: 0.68 mg/dL (ref 0.44–1.00)
GFR, Estimated: >60 mL/min
Glucose, Bld: 88 mg/dL (ref 70–99)
Potassium: 4.7 mmol/L (ref 3.5–5.1)
Sodium: 134 mmol/L — ABNORMAL LOW (ref 135–145)
Total Bilirubin: 0.6 mg/dL (ref 0.0–1.2)
Total Protein: 6.8 g/dL (ref 6.5–8.1)

## 2024-03-29 LAB — CBC WITH DIFFERENTIAL/PLATELET
Abs Immature Granulocytes: 0.01 K/uL (ref 0.00–0.07)
Basophils Absolute: 0 K/uL (ref 0.0–0.1)
Basophils Relative: 0 %
Eosinophils Absolute: 0.1 K/uL (ref 0.0–0.5)
Eosinophils Relative: 2 %
HCT: 34.5 % — ABNORMAL LOW (ref 36.0–46.0)
Hemoglobin: 10.7 g/dL — ABNORMAL LOW (ref 12.0–15.0)
Immature Granulocytes: 0 %
Lymphocytes Relative: 38 %
Lymphs Abs: 2.3 K/uL (ref 0.7–4.0)
MCH: 27.6 pg (ref 26.0–34.0)
MCHC: 31 g/dL (ref 30.0–36.0)
MCV: 88.9 fL (ref 80.0–100.0)
Monocytes Absolute: 0.4 K/uL (ref 0.1–1.0)
Monocytes Relative: 6 %
Neutro Abs: 3.1 K/uL (ref 1.7–7.7)
Neutrophils Relative %: 54 %
Platelets: 199 K/uL (ref 150–400)
RBC: 3.88 MIL/uL (ref 3.87–5.11)
RDW: 13.5 % (ref 11.5–15.5)
WBC: 5.9 K/uL (ref 4.0–10.5)
nRBC: 0 % (ref 0.0–0.2)

## 2024-03-29 LAB — D-DIMER, QUANTITATIVE: D-Dimer, Quant: 1.87 ug{FEU}/mL — ABNORMAL HIGH (ref 0.00–0.50)

## 2024-03-29 LAB — LIPASE, BLOOD: Lipase: 24 U/L (ref 11–51)

## 2024-03-29 MED ORDER — MORPHINE SULFATE (PF) 4 MG/ML IV SOLN
4.0000 mg | Freq: Once | INTRAVENOUS | Status: AC
  Administered 2024-03-29: 4 mg via INTRAVENOUS
  Filled 2024-03-29: qty 1

## 2024-03-29 MED ORDER — KETOROLAC TROMETHAMINE 15 MG/ML IJ SOLN
15.0000 mg | Freq: Once | INTRAMUSCULAR | Status: AC
  Administered 2024-03-29: 15 mg via INTRAVENOUS
  Filled 2024-03-29: qty 1

## 2024-03-29 MED ORDER — ONDANSETRON HCL 4 MG/2ML IJ SOLN: 4.0000 mg | Freq: Once | INTRAMUSCULAR | Status: AC

## 2024-03-29 MED ORDER — ONDANSETRON HCL 4 MG/2ML IJ SOLN
INTRAMUSCULAR | Status: AC
  Administered 2024-03-29: 4 mg via INTRAVENOUS
  Filled 2024-03-29: qty 2

## 2024-03-29 NOTE — ED Triage Notes (Signed)
 Pt arrived via POV for abdominal pain. Pt was here([facility]) Saturday morning for abdominal pain, pt was told she had gallbladder issues. Pt has ran out of pain meds and unable to control pain. Pain is 6/10. Took tramdol at 1800.

## 2024-03-30 ENCOUNTER — Emergency Department (HOSPITAL_COMMUNITY)

## 2024-03-30 LAB — URINALYSIS, ROUTINE W REFLEX MICROSCOPIC
Bilirubin Urine: NEGATIVE
Glucose, UA: NEGATIVE mg/dL
Hgb urine dipstick: NEGATIVE
Ketones, ur: NEGATIVE mg/dL
Leukocytes,Ua: NEGATIVE
Nitrite: NEGATIVE
Protein, ur: NEGATIVE mg/dL
Specific Gravity, Urine: 1.01 (ref 1.005–1.030)
pH: 6 (ref 5.0–8.0)

## 2024-03-30 LAB — RESP PANEL BY RT-PCR (RSV, FLU A&B, COVID)  RVPGX2
Influenza A by PCR: NEGATIVE
Influenza B by PCR: NEGATIVE
Resp Syncytial Virus by PCR: NEGATIVE
SARS Coronavirus 2 by RT PCR: NEGATIVE

## 2024-03-30 MED ORDER — IOHEXOL 350 MG/ML SOLN
75.0000 mL | Freq: Once | INTRAVENOUS | Status: AC | PRN
  Administered 2024-03-30: 75 mL via INTRAVENOUS

## 2024-03-30 MED ORDER — NAPROXEN 500 MG PO TABS: 500.0000 mg | ORAL_TABLET | Freq: Two times a day (BID) | ORAL | 0 refills | Status: AC

## 2024-03-30 MED ORDER — OSCIMIN 0.125 MG SL SUBL: 1.0000 | SUBLINGUAL_TABLET | Freq: Four times a day (QID) | SUBLINGUAL | 0 refills | Status: AC | PRN

## 2024-03-30 NOTE — Discharge Instructions (Signed)
 Your workup this evening is reassuring.  Please continue to plan to follow-up with general surgery.  I have prescribed medication for both abdominal cramping and for anti-inflammation.  Please take as directed.  I also recommend following up with your primary care provider.  Return to the emergency department if you develop any life-threatening symptoms.

## 2024-03-30 NOTE — ED Provider Notes (Signed)
 " [place] EMERGENCY DEPARTMENT AT [facility] Provider Note   CSN: [id number] Arrival date & time: 03/29/24  1946     Patient presents with: Abdominal Pain   Mikayla Humphrey is a 43 y.o. female.  Patient presents to the emergency department complaining of generalized abdominal pain, bilateral low back pain, bilateral shoulder pain.  She states the pain initially began on Saturday.  When she came to the hospital Saturday she was concerned that she may be having a cardiac issue as she was having some chest pain with shortness of breath and was very diaphoretic.  She had negative troponins during that visit but she had a right upper quadrant ultrasound which showed a gallstone in the gallbladder neck.  She was diagnosed with biliary colic and discharged with tramadol  and recommendations for outpatient surgical consultation.  She is back tonight with continued pain.  She states it has not responded well to the tramadol .  Her pain is now as noted, more widespread with pain in the low back, bilateral shoulders, and abdomen.  She denies any abdominal tenderness.  She endorses mild nausea but denies emesis.  She denies any urinary symptoms, shortness of breath at this time.  Past medical history significant for, anxiety, paresthesias followed by neurology    Abdominal Pain      Prior to Admission medications  Medication Sig Start Date End Date Taking? Authorizing Provider  Hyoscyamine  Sulfate SL (OSCIMIN ) 0.125 MG SUBL Place 1 tablet (0.125 mg total) under the tongue every 6 (six) hours as needed (Cramping). 03/30/24  Yes Logan Ubaldo NOVAK, PA-C  naproxen  (NAPROSYN ) 500 MG tablet Take 1 tablet (500 mg total) by mouth 2 (two) times daily for 25 days. 03/30/24 04/24/24 Yes Logan Ubaldo NOVAK, PA-C  etonogestrel (NEXPLANON) 68 MG IMPL implant 68 mg by Subdermal route continuous.    [provider]  Iron-Vitamin C (IRON 100/C PO) Take by mouth.    [provider]  meclizine   (ANTIVERT ) 25 MG tablet Take 1 tablet (25 mg total) by mouth 3 (three) times daily as needed for dizziness. Patient not taking: Reported on 01/08/2023 12/25/22   Henderly, Britni A, PA-C  ondansetron  (ZOFRAN -ODT) 4 MG disintegrating tablet Take 1 tablet (4 mg total) by mouth every 8 (eight) hours as needed for nausea or vomiting. 03/28/24   Raford Lenis, MD  traMADol  (ULTRAM ) 50 MG tablet Take 1 tablet (50 mg total) by mouth every 6 (six) hours as needed. 03/28/24   Raford Lenis, MD  WEGOVY 0.5 MG/0.5ML SOAJ Inject 0.5 mg into the skin once a week. 11/14/22   [provider]    Allergies: Amoxicillin    Review of Systems  Gastrointestinal:  Positive for abdominal pain.    Updated Vital Signs BP (!) 111/56   Pulse 78   Temp 98.2 F (36.8 C) (Oral)   Resp 16   SpO2 100%   Physical Exam Vitals and nursing note reviewed.  Constitutional:      General: She is not in acute distress.    Appearance: She is well-developed.  HENT:     Head: Normocephalic and atraumatic.  Eyes:     Conjunctiva/sclera: Conjunctivae normal.  Cardiovascular:     Rate and Rhythm: Normal rate and regular rhythm.  Pulmonary:     Effort: Pulmonary effort is normal. No respiratory distress.     Breath sounds: Normal breath sounds.  Abdominal:     Palpations: Abdomen is soft.     Tenderness: There is no abdominal  tenderness.  Musculoskeletal:        General: No swelling.     Cervical back: Neck supple.  Skin:    General: Skin is warm and dry.     Capillary Refill: Capillary refill takes less than 2 seconds.  Neurological:     Mental Status: She is alert.  Psychiatric:        Mood and Affect: Mood normal.     (all labs ordered are listed, but only abnormal results are displayed) Labs Reviewed  COMPREHENSIVE METABOLIC PANEL WITH GFR - Abnormal; Notable for the following components:      Result Value   Sodium 134 (*)    Calcium 8.7 (*)    All other components within normal limits  CBC WITH  DIFFERENTIAL/PLATELET - Abnormal; Notable for the following components:   Hemoglobin 10.7 (*)    HCT 34.5 (*)    All other components within normal limits  URINALYSIS, ROUTINE W REFLEX MICROSCOPIC - Abnormal; Notable for the following components:   Color, Urine STRAW (*)    Bacteria, UA RARE (*)    All other components within normal limits  D-DIMER, QUANTITATIVE - Abnormal; Notable for the following components:   D-Dimer, Quant 1.87 (*)    All other components within normal limits  RESP PANEL BY RT-PCR (RSV, FLU A&B, COVID)  RVPGX2  LIPASE, BLOOD  HCG, SERUM, QUALITATIVE    EKG: EKG Interpretation Date/Time:  Sunday March 29 2024 22:57:35 EST Ventricular Rate:  67 PR Interval:  114 QRS Duration:  87 QT Interval:  430 QTC Calculation: 454 R Axis:   56  Text Interpretation: Sinus rhythm Borderline short PR interval Low voltage, precordial leads Confirmed by Trine Likes [phone]) on 03/30/2024 1:51:47 AM  Radiology: CT Angio Chest PE W and/or Wo Contrast Result Date: 03/30/2024 CLINICAL DATA:  Positive D-dimer and pain, initial encounter EXAM: CT ANGIOGRAPHY CHEST WITH CONTRAST TECHNIQUE: Multidetector CT imaging of the chest was performed using the standard protocol during bolus administration of intravenous contrast. Multiplanar CT image reconstructions and MIPs were obtained to evaluate the vascular anatomy. RADIATION DOSE REDUCTION: This exam was performed according to the departmental dose-optimization program which includes automated exposure control, adjustment of the mA and/or kV according to patient size and/or use of iterative reconstruction technique. CONTRAST:  75mL OMNIPAQUE  IOHEXOL  350 MG/ML SOLN COMPARISON:  None Available. FINDINGS: Cardiovascular: Thoracic aorta shows a normal branching pattern. No aneurysmal dilatation or dissection is seen. The heart is not significantly enlarged in size. No coronary calcifications are seen. Pulmonary artery shows a normal branching  pattern. No intraluminal filling defect to suggest pulmonary embolism is noted. Mediastinum/Nodes: Thoracic inlet is within normal limits. The esophagus is unremarkable. No hilar or mediastinal adenopathy is noted. Lungs/Pleura: The lungs are well aerated bilaterally. No focal infiltrate or effusion is seen. No sizable parenchymal nodule is noted. Upper Abdomen: Visualized upper abdomen shows no acute abnormality. Known cholelithiasis is not well appreciated on this exam. Musculoskeletal: No chest wall abnormality. No acute or significant osseous findings. Review of the MIP images confirms the above findings. IMPRESSION: No evidence of pulmonary emboli. No other focal abnormality is noted. Electronically Signed   By: Oneil Devonshire M.D.   On: 03/30/2024 00:54   US  Abdomen Limited Result Date: 03/28/2024 EXAM: Right Upper Quadrant Abdominal Ultrasound 03/28/2024 06:55:00 AM TECHNIQUE: Real-time ultrasonography of the right upper quadrant of the abdomen was performed. COMPARISON: 03/28/2024 CLINICAL HISTORY: RUQ pain. FINDINGS: LIVER: Normal echogenicity. No intrahepatic biliary ductal dilatation. No evidence of  mass. Hepatopetal flow in the portal vein. BILIARY SYSTEM: There is a 2.5 x 2.2 cm stone within the gallbladder neck. No gallbladder wall thickening, pericholecystic fluid, sludge, or sonographic Murphy sign. The common bile duct is within normal limits measuring 2.2 mm. RIGHT KIDNEY: No hydronephrosis. No echogenic calculi. No mass. PANCREAS: Visualized portions of the pancreas are unremarkable. OTHER: No right upper quadrant ascites. IMPRESSION: 1. 2.5 x 2.2 cm stone within the gallbladder neck. 2. No secondary signs of of acute cholecystitis. If there is a high clinical concern for acute cholecystitis and further imaging is clinically indicated, a nuclear medicine hepatobiliary scan would be recommended. Electronically signed by: Waddell Calk MD 03/28/2024 07:30 AM EST RP Workstation: [id number]   CT  ABDOMEN PELVIS W CONTRAST Result Date: 03/28/2024 EXAM: CT ABDOMEN AND PELVIS WITH CONTRAST 03/28/2024 04:44:43 AM TECHNIQUE: CT of the abdomen and pelvis was performed with the administration of intravenous contrast. Multiplanar reformatted images are provided for review. Automated exposure control, iterative reconstruction, and/or weight-based adjustment of the mA/kV was utilized to reduce the radiation dose to as low as reasonably achievable. COMPARISON: None available. CLINICAL HISTORY: Abdominal pain, acute, nonlocalized FINDINGS: LOWER CHEST: No acute abnormality. LIVER: The liver is unremarkable. GALLBLADDER AND BILE DUCTS: Gallbladder is unremarkable. No biliary ductal dilatation. SPLEEN: No acute abnormality. PANCREAS: No acute abnormality. ADRENAL GLANDS: No acute abnormality. KIDNEYS, URETERS AND BLADDER: No stones in the kidneys or ureters. No hydronephrosis. No perinephric or periureteral stranding. Urinary bladder is unremarkable. GI AND BOWEL: Stomach demonstrates no acute abnormality. There is no bowel obstruction. PERITONEUM AND RETROPERITONEUM: No ascites. No free air. VASCULATURE: Aorta is normal in caliber. LYMPH NODES: No lymphadenopathy. REPRODUCTIVE ORGANS: No acute abnormality. BONES AND SOFT TISSUES: No acute osseous abnormality. No focal soft tissue abnormality. IMPRESSION: 1. No acute findings in the abdomen or pelvis. Electronically signed by: Evalene Coho MD 03/28/2024 05:09 AM EST RP Workstation: [id number]     Procedures   Medications Ordered in the ED  ketorolac  (TORADOL ) 15 MG/ML injection 15 mg (15 mg Intravenous Given 03/29/24 2321)  morphine  (PF) 4 MG/ML injection 4 mg (4 mg Intravenous Given 03/29/24 2323)  ondansetron  (ZOFRAN ) injection 4 mg (4 mg Intravenous Given 03/29/24 2318)  iohexol  (OMNIPAQUE ) 350 MG/ML injection 75 mL (75 mLs Intravenous Contrast Given 03/30/24 0037)                                    Medical Decision Making Amount and/or  Complexity of Data Reviewed Labs: ordered. Radiology: ordered.  Risk Prescription drug management.   This patient presents to the ED for concern of abdominal pain, back pain, nausea, this involves an extensive number of treatment options, and is a complaint that carries with it a high risk of complications and morbidity.  The differential diagnosis includes cholecystitis, cholelithiasis, biliary colic, PE, pneumonia, other   Co morbidities / Chronic conditions that complicate the patient evaluation  As noted in HPI   Additional history obtained:  Additional history obtained from EMR External records from outside source obtained and reviewed including neurology note, imaging from yesterday's visit   Lab Tests:  I Ordered, and personally interpreted labs.  The pertinent results include: Grossly unremarkable CMP, CBC, lipase.  D-dimer elevated at 1.87   Imaging Studies ordered:  I ordered imaging studies including CT angio chest PE study I independently visualized and interpreted imaging which showed no acute findings I agree with the radiologist  interpretation   Cardiac Monitoring: / EKG:  The patient was maintained on a cardiac monitor.  I personally viewed and interpreted the cardiac monitored which showed an underlying rhythm of: Sinus rhythm   Problem List / ED Course / Critical interventions / Medication management   I ordered medication including Toradol , Zofran , morphine  Reevaluation of the patient after these medicines showed that the patient improved I have reviewed the patients home medicines and have made adjustments as needed    Test / Admission - Considered:  Patient with no abdominal tenderness on examination tonight.  At this time I doubt a gallbladder etiology for patient's symptoms.  Based on her presentation yesterday with shortness of breath and chest pain I felt it was worthwhile to check a D-dimer.  This was elevated and a CT angio chest PE study  was performed which was negative for any acute abnormalities.  No sign of blood clot or pneumonia.  Unclear at this time as to what is causing this patient's widespread discomfort.  She is comfortable after receiving the Toradol .  Plan to discharge with prescription for anti-inflammatory medication.  Patient will continue to plan to follow-up with general surgery for evaluation of her history of gallstones and will also follow-up with her primary care provider.      Final diagnoses:  Abdominal pain, unspecified abdominal location    ED Discharge Orders          Ordered    Hyoscyamine  Sulfate SL (OSCIMIN ) 0.125 MG SUBL  Every 6 hours PRN        03/30/24 0155    naproxen  (NAPROSYN ) 500 MG tablet  2 times daily        03/30/24 0155               Logan Ubaldo KATHEE DEVONNA 03/30/24 0155    Trine Raynell Moder, MD 03/30/24 [id number]  "

## 2024-04-06 ENCOUNTER — Encounter (HOSPITAL_COMMUNITY): Payer: Self-pay | Admitting: General Surgery

## 2024-04-06 ENCOUNTER — Inpatient Hospital Stay (HOSPITAL_COMMUNITY): Admitting: Anesthesiology

## 2024-04-06 ENCOUNTER — Ambulatory Visit (HOSPITAL_COMMUNITY)
Admission: RE | Admit: 2024-04-06 | Discharge: 2024-04-06 | Disposition: A | Discharge status: Home / Self Care | Source: Ambulatory Visit | Attending: General Surgery | Admitting: General Surgery

## 2024-04-06 ENCOUNTER — Encounter (HOSPITAL_COMMUNITY)
Admission: RE | Disposition: A | Discharge status: Home / Self Care | Payer: Self-pay | Source: Ambulatory Visit | Attending: General Surgery

## 2024-04-06 ENCOUNTER — Other Ambulatory Visit: Payer: Self-pay

## 2024-04-06 ENCOUNTER — Ambulatory Visit: Payer: Self-pay | Admitting: General Surgery

## 2024-04-06 DIAGNOSIS — K819 Cholecystitis, unspecified: Secondary | ICD-10-CM

## 2024-04-06 DIAGNOSIS — K8020 Calculus of gallbladder without cholecystitis without obstruction: Secondary | ICD-10-CM

## 2024-04-06 DIAGNOSIS — K8010 Calculus of gallbladder with chronic cholecystitis without obstruction: Secondary | ICD-10-CM | POA: Diagnosis present

## 2024-04-06 HISTORY — PX: CHOLECYSTECTOMY: SHX55

## 2024-04-06 LAB — POCT PREGNANCY, URINE: Preg Test, Ur: NEGATIVE

## 2024-04-06 SURGERY — LAPAROSCOPIC CHOLECYSTECTOMY
Anesthesia: General

## 2024-04-06 MED ORDER — BUPIVACAINE-EPINEPHRINE (PF) 0.25% -1:200000 IJ SOLN
INTRAMUSCULAR | Status: DC | PRN
  Administered 2024-04-06: 15 mL

## 2024-04-06 MED ORDER — BUPIVACAINE-EPINEPHRINE (PF) 0.25% -1:200000 IJ SOLN
INTRAMUSCULAR | Status: AC
  Filled 2024-04-06: qty 30

## 2024-04-06 MED ORDER — ONDANSETRON HCL 4 MG/2ML IJ SOLN
INTRAMUSCULAR | Status: AC
  Filled 2024-04-06: qty 2

## 2024-04-06 MED ORDER — FENTANYL CITRATE (PF) 100 MCG/2ML IJ SOLN
INTRAMUSCULAR | Status: DC | PRN
  Administered 2024-04-06 (×5): 50 ug via INTRAVENOUS

## 2024-04-06 MED ORDER — LACTATED RINGERS IV SOLN: INTRAVENOUS | Status: DC

## 2024-04-06 MED ORDER — ROCURONIUM BROMIDE 100 MG/10ML IV SOLN
INTRAVENOUS | Status: DC | PRN
  Administered 2024-04-06: 50 mg via INTRAVENOUS

## 2024-04-06 MED ORDER — SUGAMMADEX SODIUM 200 MG/2ML IV SOLN
INTRAVENOUS | Status: DC | PRN
  Administered 2024-04-06: 200 mg via INTRAVENOUS
  Administered 2024-04-06: 50 mg via INTRAVENOUS

## 2024-04-06 MED ORDER — DEXAMETHASONE SOD PHOSPHATE PF 10 MG/ML IJ SOLN
INTRAMUSCULAR | Status: DC | PRN
  Administered 2024-04-06: 5 mg via INTRAVENOUS

## 2024-04-06 MED ORDER — PROPOFOL 500 MG/50ML IV EMUL
INTRAVENOUS | Status: DC | PRN
  Administered 2024-04-06: 25 ug/kg/min via INTRAVENOUS

## 2024-04-06 MED ORDER — 0.9 % SODIUM CHLORIDE (POUR BTL) OPTIME
TOPICAL | Status: DC | PRN
  Administered 2024-04-06: 1000 mL

## 2024-04-06 MED ORDER — PROPOFOL 10 MG/ML IV BOLUS
INTRAVENOUS | Status: DC | PRN
  Administered 2024-04-06: 200 mg via INTRAVENOUS

## 2024-04-06 MED ORDER — HYDROMORPHONE HCL 1 MG/ML IJ SOLN
INTRAMUSCULAR | Status: AC
  Filled 2024-04-06: qty 1

## 2024-04-06 MED ORDER — OXYCODONE HCL 5 MG PO TABS: 5.0000 mg | ORAL_TABLET | Freq: Once | ORAL | Status: DC | PRN

## 2024-04-06 MED ORDER — DEXMEDETOMIDINE HCL IN NACL 80 MCG/20ML IV SOLN
INTRAVENOUS | Status: AC
  Filled 2024-04-06: qty 20

## 2024-04-06 MED ORDER — CHLORHEXIDINE GLUCONATE 0.12 % MT SOLN
15.0000 mL | Freq: Once | OROMUCOSAL | Status: AC
  Administered 2024-04-06: 15 mL via OROMUCOSAL

## 2024-04-06 MED ORDER — MIDAZOLAM HCL (PF) 2 MG/2ML IJ SOLN: 0.5000 mg | Freq: Once | INTRAMUSCULAR | Status: DC | PRN

## 2024-04-06 MED ORDER — INDOCYANINE GREEN 25 MG IJ SOLR
2.5000 mg | Freq: Once | INTRAMUSCULAR | Status: AC
  Administered 2024-04-06: 2.5 mg via INTRAVENOUS
  Filled 2024-04-06: qty 10

## 2024-04-06 MED ORDER — SCOPOLAMINE 1 MG/3DAYS TD PT72
1.0000 | MEDICATED_PATCH | TRANSDERMAL | Status: DC
  Administered 2024-04-06: 1 mg via TRANSDERMAL
  Filled 2024-04-06: qty 1

## 2024-04-06 MED ORDER — AMISULPRIDE (ANTIEMETIC) 5 MG/2ML IV SOLN
10.0000 mg | Freq: Once | INTRAVENOUS | Status: AC
  Administered 2024-04-06: 10 mg via INTRAVENOUS

## 2024-04-06 MED ORDER — CEFAZOLIN SODIUM-DEXTROSE 2-4 GM/100ML-% IV SOLN
2.0000 g | INTRAVENOUS | Status: AC
  Administered 2024-04-06: 2 g via INTRAVENOUS
  Filled 2024-04-06: qty 100

## 2024-04-06 MED ORDER — PROPOFOL 10 MG/ML IV BOLUS
INTRAVENOUS | Status: AC
  Filled 2024-04-06: qty 20

## 2024-04-06 MED ORDER — TRAMADOL HCL 50 MG PO TABS: 50.0000 mg | ORAL_TABLET | Freq: Four times a day (QID) | ORAL | 0 refills | Status: AC | PRN

## 2024-04-06 MED ORDER — FENTANYL CITRATE (PF) 250 MCG/5ML IJ SOLN
INTRAMUSCULAR | Status: AC
  Filled 2024-04-06: qty 5

## 2024-04-06 MED ORDER — SUGAMMADEX SODIUM 200 MG/2ML IV SOLN
INTRAVENOUS | Status: AC
  Filled 2024-04-06: qty 2

## 2024-04-06 MED ORDER — MIDAZOLAM HCL 2 MG/2ML IJ SOLN
INTRAMUSCULAR | Status: AC
  Filled 2024-04-06: qty 2

## 2024-04-06 MED ORDER — ENOXAPARIN SODIUM 40 MG/0.4ML IJ SOSY
40.0000 mg | PREFILLED_SYRINGE | Freq: Once | INTRAMUSCULAR | Status: AC
  Administered 2024-04-06: 40 mg via SUBCUTANEOUS
  Filled 2024-04-06: qty 0.4

## 2024-04-06 MED ORDER — ONDANSETRON HCL 4 MG/2ML IJ SOLN
INTRAMUSCULAR | Status: DC | PRN
  Administered 2024-04-06: 4 mg via INTRAVENOUS

## 2024-04-06 MED ORDER — LIDOCAINE HCL (CARDIAC) PF 100 MG/5ML IV SOSY
PREFILLED_SYRINGE | INTRAVENOUS | Status: DC | PRN
  Administered 2024-04-06: 60 mg via INTRAVENOUS

## 2024-04-06 MED ORDER — ACETAMINOPHEN 500 MG PO TABS: 1000.0000 mg | ORAL_TABLET | ORAL | Status: DC

## 2024-04-06 MED ORDER — AMISULPRIDE (ANTIEMETIC) 5 MG/2ML IV SOLN
INTRAVENOUS | Status: AC
  Filled 2024-04-06: qty 4

## 2024-04-06 MED ORDER — HYDROMORPHONE HCL 1 MG/ML IJ SOLN
0.2500 mg | INTRAMUSCULAR | Status: DC | PRN
  Administered 2024-04-06 (×4): 0.5 mg via INTRAVENOUS

## 2024-04-06 MED ORDER — MIDAZOLAM HCL 5 MG/5ML IJ SOLN
INTRAMUSCULAR | Status: DC | PRN
  Administered 2024-04-06 (×2): 1 mg via INTRAVENOUS

## 2024-04-06 MED ORDER — LACTATED RINGERS IR SOLN
Status: DC | PRN
  Administered 2024-04-06: 1000 mL

## 2024-04-06 MED ORDER — DEXMEDETOMIDINE HCL IN NACL 80 MCG/20ML IV SOLN
INTRAVENOUS | Status: DC | PRN
  Administered 2024-04-06 (×2): 8 ug via INTRAVENOUS

## 2024-04-06 MED ORDER — ACETAMINOPHEN 500 MG PO TABS
1000.0000 mg | ORAL_TABLET | Freq: Once | ORAL | Status: AC
  Administered 2024-04-06: 1000 mg via ORAL
  Filled 2024-04-06: qty 2

## 2024-04-06 MED ORDER — MEPERIDINE HCL 25 MG/ML IJ SOLN: 6.2500 mg | INTRAMUSCULAR | Status: DC | PRN

## 2024-04-06 MED ORDER — OXYCODONE HCL 5 MG/5ML PO SOLN: 5.0000 mg | Freq: Once | ORAL | Status: DC | PRN

## 2024-04-06 SURGICAL SUPPLY — 31 items
BAG COUNTER SPONGE SURGICOUNT (BAG) IMPLANT
CHLORAPREP W/TINT 26 (MISCELLANEOUS) ×1 IMPLANT
CLIP APPLIE 5 13 M/L LIGAMAX5 (MISCELLANEOUS) IMPLANT
CLIP LIGATING HEMO O LOK GREEN (MISCELLANEOUS) ×1 IMPLANT
COVER MAYO STAND XLG (MISCELLANEOUS) IMPLANT
COVER TRANSDUCER ULTRASND (DRAPES) ×1 IMPLANT
DERMABOND ADVANCED .7 DNX12 (GAUZE/BANDAGES/DRESSINGS) ×1 IMPLANT
DRAPE C-ARM 42X120 X-RAY (DRAPES) IMPLANT
ELECT REM PT RETURN 15FT ADLT (MISCELLANEOUS) ×1 IMPLANT
GAUZE SPONGE 2X2 8PLY STRL LF (GAUZE/BANDAGES/DRESSINGS) ×1 IMPLANT
GLOVE BIO SURGEON STRL SZ7.5 (GLOVE) ×1 IMPLANT
GOWN STRL REUS W/ TWL XL LVL3 (GOWN DISPOSABLE) ×1 IMPLANT
GRASPER SUT TROCAR 14GX15 (MISCELLANEOUS) IMPLANT
IRRIGATION SUCT STRKRFLW 2 WTP (MISCELLANEOUS) ×1 IMPLANT
KIT BASIN OR (CUSTOM PROCEDURE TRAY) ×1 IMPLANT
KIT IMAGING PINPOINTPAQ (MISCELLANEOUS) IMPLANT
KIT TURNOVER KIT A (KITS) ×1 IMPLANT
NEEDLE INSUFFLATION 14GA 120MM (NEEDLE) ×1 IMPLANT
PENCIL SMOKE EVACUATOR (MISCELLANEOUS) IMPLANT
POUCH RETRIEVAL ECOSAC 10 (ENDOMECHANICALS) IMPLANT
SCISSORS LAP 5X35 DISP (ENDOMECHANICALS) ×1 IMPLANT
SET CHOLANGIOGRAPH MIX (MISCELLANEOUS) IMPLANT
SET TUBE SMOKE EVAC HIGH FLOW (TUBING) ×1 IMPLANT
SLEEVE Z-THREAD 5X100MM (TROCAR) ×1 IMPLANT
SPIKE FLUID TRANSFER (MISCELLANEOUS) ×1 IMPLANT
SUT MNCRL AB 4-0 PS2 18 (SUTURE) ×1 IMPLANT
SUT VICRYL 0 UR6 27IN ABS (SUTURE) ×1 IMPLANT
TOWEL OR DSP ST BLU DLX 10/PK (DISPOSABLE) ×1 IMPLANT
TRAY LAPAROSCOPIC (CUSTOM PROCEDURE TRAY) ×1 IMPLANT
TROCAR 11X100 Z THREAD (TROCAR) ×1 IMPLANT
TROCAR Z-THREAD OPTICAL 5X100M (TROCAR) ×1 IMPLANT

## 2024-04-06 NOTE — H&P (Signed)
 REFERRING PHYSICIAN:  [facility] PROVIDER:  RICHERD SILVERSMITH, MD MRN: [id number] DOB: 25-Dec-1980 DATE OF ENCOUNTER: 04/06/2024 Subjective    Reason for Referral: symptomatic cholelithiasis History of Present Illness: Mikayla Humphrey is a 43 y.o. female who is seen today as an office consultation for evaluation of symptomatic cholelithiasis  Patient recently presented to the ED on 12/20 with abdominal pain that was associated with nausea, emesis and diaphoresis. This was her first episode of pain like this. Workup notable for stone within gallbladder neck. Her LFTs were within normal limits and no leukocytosis.  Patient states she has been having to take narcotic pain medication around the clock to mask the pain. When she does not take pain medication her pain is not bearable. She has had nausea with her pain previously but not currently. Has not had anything to eat or drink today.  Previous surgery of c section and lap oophorectomy for endometriosis.    Review of Systems: A complete review of systems was obtained from the patient.  I have reviewed this information and discussed as appropriate with the patient.  ROS otherwise negative except as noted in HPI.   Medical History: Past Medical History:  Diagnosis Date   Anxiety    Dizziness    Endometriosis 06/08/2010   Generalized headaches    History of anal fissures    History of anemia    History of herpes genitalis    Left ovarian cyst    Panic attacks    Pelvic pain     There is no problem list on file for this patient.   Past Surgical History:  Procedure Laterality Date   CESAREAN SECTION  2001   DIAGNOSTIC LAPAROSCOPY  02/23/2010   CESAREAN SECTION  03/19/2014   CESAREAN SECTION  10/21/2015   LAPAROSCOPIC OVARIAN CYSTECTOMY Left 12/10/2019   Wisdom tooth extraction       Allergies  Allergen Reactions   Amoxicillin Unknown    Current Outpatient Medications on File Prior to Visit  Medication Sig Dispense Refill    escitalopram oxalate (LEXAPRO) 10 MG tablet Take 10 mg by mouth once daily     hyoscyamine  (OSCIMIN  SL) 0.125 mg SL tablet Place 0.125 mg under the tongue every 6 (six) hours as needed     naproxen  (NAPROSYN ) 500 MG tablet Take 1 tablet (500 mg total) by mouth 2 (two) times daily for 25 days.     ondansetron  (ZOFRAN -ODT) 4 MG disintegrating tablet Take 4 mg by mouth every 8 (eight) hours as needed     traMADoL  (ULTRAM ) 50 mg tablet Take 50 mg by mouth every 6 (six) hours as needed (Patient not taking: Reported on 04/06/2024)     No current facility-administered medications on file prior to visit.    Family History  Problem Relation Age of Onset   Bipolar disorder Sister    High blood pressure (Hypertension) Maternal Grandmother    Stroke Paternal Grandmother      Social History   Tobacco Use  Smoking Status Never  Smokeless Tobacco Never     Social History   Socioeconomic History   Marital status: Married  Tobacco Use   Smoking status: Never   Smokeless tobacco: Never  Substance and Sexual Activity   Alcohol use: Never   Drug use: Never   Social Drivers of Health   Food Insecurity: Low Risk (02/05/2023)   Received from [facility]   Hunger Vital Sign    Within the past 12 months, you worried that your food  would run out before you got money to buy more: Never true    Within the past 12 months, the food you bought just didn't last and you didn't have money to get more. : Never true  Transportation Needs: No Transportation Needs (02/05/2023)   Received from [facility]    In the past 12 months, has lack of reliable transportation kept you from medical appointments, meetings, work or from getting things needed for daily living? : No   Received from [employer]   Social Network  Housing Stability: Unknown (04/06/2024)   Housing Stability Vital Sign    Homeless in the Last Year: No    Objective:   Vitals:   04/06/24 0913 04/06/24 0915  Pulse:  90   Temp: 36.4 C (97.6 F)   SpO2: 98%   Weight: (!) 105.2 kg (232 lb)   Height: 170.2 cm (5' 7)   PainSc:  0-No pain    Body mass index is 36.34 kg/m.  Physical Exam: General: No acute distress, well appearing HEENT: PERRL, hearing grossly normal, mucous membranes moist CV: Regular rate and rhythm Pulm: Normal work of breathing on room air Abd: Soft, mildly tender in RUQ with deep palpation. Extremities: Warm and well perfused Neuro: A&O x4, no focal neurologic deficits Psych: Appropriate mood and effect  Labs, Imaging and Diagnostic Testing: I have personally reviewed all imaging and agree with radiologist's interpretation. RUQ US  03/28/24: 2.5x2.2cm stone in the gallbladder neck without gallbladder wall thickening, pericholecystic fluid or sludge. Negative sonographic Murphy's sign. CBD measures 2.60mm CT AP 03/28/24: Gallbladder unremarkable, no biliary ductal dilatation I have personally reviewed any pertinent labs. I have personally reviewed EDP notes from 12/20 and 12/22.  Assessment and Plan:     Diagnoses and all orders for this visit:  Symptomatic cholelithiasis     - Plan to proceed to OR today for laparoscopic cholecystectomy with Dr. Rubin given her continued narcotic requirements to mask the pain. - We discussed the etiology of patient's pain, we discussed treatment options and recommended surgery. We discussed details of surgery including general anesthesia, laparoscopic approach, identification of cystic duct and common bile duct. Ligation of cystic duct and cystic artery. Possible need for intraoperative cholangiogram, open procedure, and subtotal cholecystectomy. Possible risks of common bile duct injury, injury to surrounding structures, bile leak, bleeding, infection, diarrhea, retained stone and hernia. The patient showed good understanding and all questions were answered.   Orie Silversmith, MD [facility] Surgery

## 2024-04-06 NOTE — Interval H&P Note (Signed)
 History and Physical Interval Note:  04/06/2024 10:26 AM  [name]  has presented today for surgery, with the diagnosis of SYMPTOMATIC CHOLEYCYSITIS.  The various methods of treatment have been discussed with the patient and family. After consideration of risks, benefits and other options for treatment, the patient has consented to  Procedures with comments: LAPAROSCOPIC CHOLECYSTECTOMY (N/A) - ICG as a surgical intervention.  The patient's history has been reviewed, patient examined, no change in status, stable for surgery.  I have reviewed the patient's chart and labs.  Questions were answered to the patient's satisfaction.     Mikayla Humphrey

## 2024-04-06 NOTE — Anesthesia Postprocedure Evaluation (Signed)
"   Anesthesia Post Note  Patient: Mikayla Humphrey  Procedure(s) Performed: LAPAROSCOPIC CHOLECYSTECTOMY     Patient location during evaluation: PACU Anesthesia Type: General Level of consciousness: awake and alert, oriented and patient cooperative Pain management: pain level controlled Vital Signs Assessment: post-procedure vital signs reviewed and stable Respiratory status: spontaneous breathing, nonlabored ventilation and respiratory function stable Cardiovascular status: blood pressure returned to baseline and stable Postop Assessment: no apparent nausea or vomiting and able to ambulate Anesthetic complications: no   No notable events documented.  Last Vitals:  Vitals:   04/06/24 1300 04/06/24 1315  BP: (!) 100/46 (!) 110/58  Pulse: (!) 57 73  Resp: 11 (!) 23  Temp:    SpO2: 93% 93%    Last Pain:  Vitals:   04/06/24 1300  PainSc: 0-No pain                 [name],E. Benicia Bergevin      "

## 2024-04-06 NOTE — H&P (View-Only) (Signed)
 REFERRING PHYSICIAN:  [facility] PROVIDER:  RICHERD SILVERSMITH, MD MRN: [id number] DOB: 25-Dec-1980 DATE OF ENCOUNTER: 04/06/2024 Subjective    Reason for Referral: symptomatic cholelithiasis History of Present Illness: Mikayla Humphrey is a 43 y.o. female who is seen today as an office consultation for evaluation of symptomatic cholelithiasis  Patient recently presented to the ED on 12/20 with abdominal pain that was associated with nausea, emesis and diaphoresis. This was her first episode of pain like this. Workup notable for stone within gallbladder neck. Her LFTs were within normal limits and no leukocytosis.  Patient states she has been having to take narcotic pain medication around the clock to mask the pain. When she does not take pain medication her pain is not bearable. She has had nausea with her pain previously but not currently. Has not had anything to eat or drink today.  Previous surgery of c section and lap oophorectomy for endometriosis.    Review of Systems: A complete review of systems was obtained from the patient.  I have reviewed this information and discussed as appropriate with the patient.  ROS otherwise negative except as noted in HPI.   Medical History: Past Medical History:  Diagnosis Date   Anxiety    Dizziness    Endometriosis 06/08/2010   Generalized headaches    History of anal fissures    History of anemia    History of herpes genitalis    Left ovarian cyst    Panic attacks    Pelvic pain     There is no problem list on file for this patient.   Past Surgical History:  Procedure Laterality Date   CESAREAN SECTION  2001   DIAGNOSTIC LAPAROSCOPY  02/23/2010   CESAREAN SECTION  03/19/2014   CESAREAN SECTION  10/21/2015   LAPAROSCOPIC OVARIAN CYSTECTOMY Left 12/10/2019   Wisdom tooth extraction       Allergies  Allergen Reactions   Amoxicillin Unknown    Current Outpatient Medications on File Prior to Visit  Medication Sig Dispense Refill    escitalopram oxalate (LEXAPRO) 10 MG tablet Take 10 mg by mouth once daily     hyoscyamine  (OSCIMIN  SL) 0.125 mg SL tablet Place 0.125 mg under the tongue every 6 (six) hours as needed     naproxen  (NAPROSYN ) 500 MG tablet Take 1 tablet (500 mg total) by mouth 2 (two) times daily for 25 days.     ondansetron  (ZOFRAN -ODT) 4 MG disintegrating tablet Take 4 mg by mouth every 8 (eight) hours as needed     traMADoL  (ULTRAM ) 50 mg tablet Take 50 mg by mouth every 6 (six) hours as needed (Patient not taking: Reported on 04/06/2024)     No current facility-administered medications on file prior to visit.    Family History  Problem Relation Age of Onset   Bipolar disorder Sister    High blood pressure (Hypertension) Maternal Grandmother    Stroke Paternal Grandmother      Social History   Tobacco Use  Smoking Status Never  Smokeless Tobacco Never     Social History   Socioeconomic History   Marital status: Married  Tobacco Use   Smoking status: Never   Smokeless tobacco: Never  Substance and Sexual Activity   Alcohol use: Never   Drug use: Never   Social Drivers of Health   Food Insecurity: Low Risk (02/05/2023)   Received from [facility]   Hunger Vital Sign    Within the past 12 months, you worried that your food  would run out before you got money to buy more: Never true    Within the past 12 months, the food you bought just didn't last and you didn't have money to get more. : Never true  Transportation Needs: No Transportation Needs (02/05/2023)   Received from [facility]    In the past 12 months, has lack of reliable transportation kept you from medical appointments, meetings, work or from getting things needed for daily living? : No   Received from [employer]   Social Network  Housing Stability: Unknown (04/06/2024)   Housing Stability Vital Sign    Homeless in the Last Year: No    Objective:   Vitals:   04/06/24 0913 04/06/24 0915  Pulse:  90   Temp: 36.4 C (97.6 F)   SpO2: 98%   Weight: (!) 105.2 kg (232 lb)   Height: 170.2 cm (5' 7)   PainSc:  0-No pain    Body mass index is 36.34 kg/m.  Physical Exam: General: No acute distress, well appearing HEENT: PERRL, hearing grossly normal, mucous membranes moist CV: Regular rate and rhythm Pulm: Normal work of breathing on room air Abd: Soft, mildly tender in RUQ with deep palpation. Extremities: Warm and well perfused Neuro: A&O x4, no focal neurologic deficits Psych: Appropriate mood and effect  Labs, Imaging and Diagnostic Testing: I have personally reviewed all imaging and agree with radiologist's interpretation. RUQ US  03/28/24: 2.5x2.2cm stone in the gallbladder neck without gallbladder wall thickening, pericholecystic fluid or sludge. Negative sonographic Murphy's sign. CBD measures 2.60mm CT AP 03/28/24: Gallbladder unremarkable, no biliary ductal dilatation I have personally reviewed any pertinent labs. I have personally reviewed EDP notes from 12/20 and 12/22.  Assessment and Plan:     Diagnoses and all orders for this visit:  Symptomatic cholelithiasis     - Plan to proceed to OR today for laparoscopic cholecystectomy with Dr. Rubin given her continued narcotic requirements to mask the pain. - We discussed the etiology of patient's pain, we discussed treatment options and recommended surgery. We discussed details of surgery including general anesthesia, laparoscopic approach, identification of cystic duct and common bile duct. Ligation of cystic duct and cystic artery. Possible need for intraoperative cholangiogram, open procedure, and subtotal cholecystectomy. Possible risks of common bile duct injury, injury to surrounding structures, bile leak, bleeding, infection, diarrhea, retained stone and hernia. The patient showed good understanding and all questions were answered.   Orie Silversmith, MD [facility]

## 2024-04-06 NOTE — Op Note (Signed)
 04/06/2024  11:29 AM  PATIENT:  Mikayla Humphrey  43 y.o. female  PRE-OPERATIVE DIAGNOSIS:  SYMPTOMATIC CHOLEYCYSITIS  POST-OPERATIVE DIAGNOSIS:  SYMPTOMATIC CHOLEYCYSITIS  PROCEDURE:  Procedures with comments: LAPAROSCOPIC CHOLECYSTECTOMY (N/A) - ICG  SURGEON:  Surgeons and Role:    DEWAINE Rubin Calamity, MD - Primary  ASSISTANTS: none   ANESTHESIA:   local and general  EBL:  minimal   BLOOD ADMINISTERED:none  DRAINS: none   LOCAL MEDICATIONS USED:  BUPIVICAINE   SPECIMEN:  Source of Specimen:  gallbladder  DISPOSITION OF SPECIMEN:  PATHOLOGY  COUNTS:  YES  TOURNIQUET:  * No tourniquets in log *  DICTATION: .Dragon Dictation   EBL: <5cc   Complications: none   Counts: reported as correct x 2   Findings:large gallstones, chronic inflammation  Indications for procedure: Pt is a 38F with RUQ pain and seen to have gallstones  With severe pain  Details of the procedure: The patient was taken to the operating and placed in the supine position with bilateral SCDs in place. A time out was called and all facts were verified. A pneumoperitoneum was obtained via A Veress needle technique to a pressure of 14mm of mercury. A 5mm trochar was then placed in the right upper quadrant under visualization, and there were no injuries to any abdominal organs. A 11 mm port was then placed in the umbilical region after infiltrating with local anesthesia under direct visualization. A second epigastric port was placed under direct visualization.   The gallbladder was identified and retracted, the peritoneum was then sharply dissected from the gallbladder and this dissection was carried down to Calot's triangle. The cystic duct was identified and dissected circumferentially and seen going into the gallbladder 360.  The cystic artery was dissected away from the surrounding tissues.   The critical angle was obtained.    2 clips were placed proximally one distally and the cystic duct  transected. The cystic artery was identified and 2 clips placed proximally and one distally and transected. We then proceeded to remove the gallbladder off the hepatic fossa with Bovie cautery. A retrieval bag was then placed in the abdomen and gallbladder placed in the bag. The hepatic fossa was then reexamined and hemostasis was achieved with Bovie cautery and was excellent at this portion of the case. The subhepatic fossa and perihepatic fossa was then irrigated until the effluent was clear. The specimen bag and specimen were removed from the abdominal cavity.  The 11 mm trocar fascia was reapproximated with the Endo Close #1 Vicryl x2. The pneumoperitoneum was evacuated and all trochars removed under direct visulalization. The skin was then closed with 4-0 Monocryl and the skin dressed with Dermabond. The patient was awaken from general anesthesia and taken to the recovery room in stable condition.   PLAN OF CARE: Discharge to home after PACU  PATIENT DISPOSITION:  PACU - hemodynamically stable.   Delay start of Pharmacological VTE agent (>24hrs) due to surgical blood loss or risk of bleeding: not applicable

## 2024-04-06 NOTE — Anesthesia Procedure Notes (Signed)
 Procedure Name: Intubation Date/Time: 04/06/2024 11:01 AM  Performed by: Kathern Rollene LABOR, CRNAPre-anesthesia Checklist: Patient identified, Emergency Drugs available, Suction available and Patient being monitored Patient Re-evaluated:Patient Re-evaluated prior to induction Oxygen Delivery Method: Circle system utilized Preoxygenation: Pre-oxygenation with 100% oxygen Induction Type: IV induction Ventilation: Mask ventilation without difficulty Laryngoscope Size: Mac and 3 Grade View: Grade I Tube type: Oral Tube size: 7.0 mm Number of attempts: 1 Airway Equipment and Method: Stylet Placement Confirmation: ETT inserted through vocal cords under direct vision, positive ETCO2 and breath sounds checked- equal and bilateral Secured at: 21 cm Tube secured with: Tape Dental Injury: Teeth and Oropharynx as per pre-operative assessment

## 2024-04-06 NOTE — Discharge Instructions (Signed)
 [facility] ______[facility] LAPAROSCOPIC SURGERY: POST OP INSTRUCTIONS Always review your discharge instruction sheet given to you by the facility where your surgery was performed. IF YOU HAVE DISABILITY OR FAMILY LEAVE FORMS, YOU MUST BRING THEM TO THE OFFICE FOR PROCESSING.   DO NOT GIVE THEM TO YOUR DOCTOR.  A prescription for pain medication may be given to you upon discharge.  Take your pain medication as prescribed, if needed.  If narcotic pain medicine is not needed, then you may take acetaminophen  (Tylenol ) or ibuprofen  (Advil ) as needed. Take your usually prescribed medications unless otherwise directed. If you need a refill on your pain medication, please contact your pharmacy.  They will contact our office to request authorization. Prescriptions will not be filled after 5pm or on week-ends. You should follow a light diet the first few days after arrival home, such as soup and crackers, etc.  Be sure to include lots of fluids daily. Most patients will experience some swelling and bruising in the area of the incisions.  Ice packs will help.  Swelling and bruising can take several days to resolve.  It is common to experience some constipation if taking pain medication after surgery.  Increasing fluid intake and taking a stool softener (such as Colace) will usually help or prevent this problem from occurring.  A mild laxative (Milk of Magnesia or Miralax) should be taken according to package instructions if there are no bowel movements after 48 hours. Unless discharge instructions indicate otherwise, you may remove your bandages 24-48 hours after surgery, and you may shower at that time.  You may have steri-strips (small skin tapes) in place directly over the incision.  These strips should be left on the skin for 7-10 days.  If your surgeon used skin glue on the incision, you may shower in 24 hours.  The glue will flake off over the next 2-3 weeks.  Any sutures or staples will be  removed at the office during your follow-up visit. ACTIVITIES:  You may resume regular (light) daily activities beginning the next day--such as daily self-care, walking, climbing stairs--gradually increasing activities as tolerated.  You may have sexual intercourse when it is comfortable.  Refrain from any heavy lifting or straining until approved by your doctor. You may drive when you are no longer taking prescription pain medication, you can comfortably wear a seatbelt, and you can safely maneuver your car and apply brakes. RETURN TO WORK:  __________________________________________________________ Rosine should see your doctor in the office for a follow-up appointment approximately 2-3 weeks after your surgery.  Make sure that you call for this appointment within a day or two after you arrive home to insure a convenient appointment time. OTHER INSTRUCTIONS: __________________________________________________________________________________________________________________________ __________________________________________________________________________________________________________________________ WHEN TO CALL YOUR DOCTOR: Fever over 101.0 Inability to urinate Continued bleeding from incision. Increased pain, redness, or drainage from the incision. Increasing abdominal pain  The clinic staff is available to answer your questions during regular business hours.  Please don't hesitate to call and ask to speak to one of the nurses for clinical concerns.  If you have a medical emergency, go to the nearest emergency room or call 911.  A surgeon from [facility] is always on call at the hospital. [address], [address], [place], [place]  [address] ? [address] [address], [place], [place]   [address] [phone] ? [phone] ? FAX [phone] Web site: [email or web address]

## 2024-04-06 NOTE — Transfer of Care (Signed)
 Immediate Anesthesia Transfer of Care Note  Patient: Mikayla Humphrey  Procedure(s) Performed: LAPAROSCOPIC CHOLECYSTECTOMY  Patient Location: PACU  Anesthesia Type:General  Level of Consciousness: awake, alert , oriented, and patient cooperative  Airway & Oxygen Therapy: Patient Spontanous Breathing and Patient connected to face mask oxygen  Post-op Assessment: Report given to RN and Post -op Vital signs reviewed and stable  Post vital signs: Reviewed and stable  Last Vitals:  Vitals Value Taken Time  BP 116/65 04/06/24 11:50  Temp    Pulse 63 04/06/24 11:55  Resp 14 04/06/24 11:55  SpO2 96 % 04/06/24 11:55  Vitals shown include unfiled device data.  Last Pain:  Vitals:   04/06/24 1026  PainSc: 0-No pain         Complications: No notable events documented.

## 2024-04-06 NOTE — Anesthesia Preprocedure Evaluation (Addendum)
"                                    Anesthesia Evaluation  Patient identified by MRN, date of birth, ID band Patient awake    Reviewed: Allergy & Precautions, NPO status , Patient's Chart, lab work & pertinent test results  History of Anesthesia Complications Negative for: history of anesthetic complications  Airway Mallampati: II  TM Distance: >3 FB Neck ROM: Full    Dental  (+) Dental Advisory Given   Pulmonary neg pulmonary ROS   breath sounds clear to auscultation       Cardiovascular negative cardio ROS  Rhythm:Regular Rate:Normal     Neuro/Psych negative neurological ROS     GI/Hepatic Neg liver ROS,,,cholelithiasis   Endo/Other  Tzhncb  Renal/GU negative Renal ROS     Musculoskeletal   Abdominal   Peds  Hematology Hb 10.7, plt 199k   Anesthesia Other Findings   Reproductive/Obstetrics Nexplanon                              Anesthesia Physical Anesthesia Plan  ASA: 2  Anesthesia Plan: General   Post-op Pain Management: Tylenol  PO (pre-op)*   Induction: Intravenous  PONV Risk Score and Plan: 3 and Ondansetron , Dexamethasone  and Scopolamine  patch - Pre-op  Airway Management Planned: Oral ETT  Additional Equipment: None  Intra-op Plan:   Post-operative Plan: Extubation in OR  Informed Consent: I have reviewed the patients History and Physical, chart, labs and discussed the procedure including the risks, benefits and alternatives for the proposed anesthesia with the patient or authorized representative who has indicated his/her understanding and acceptance.     Dental advisory given  Plan Discussed with: CRNA and Surgeon  Anesthesia Plan Comments:          Anesthesia Quick Evaluation  "

## 2024-04-07 ENCOUNTER — Encounter (HOSPITAL_COMMUNITY): Payer: Self-pay | Admitting: General Surgery

## 2024-04-12 LAB — SURGICAL PATHOLOGY
